# Patient Record
Sex: Male | Born: 1970 | Race: White | Hispanic: No | Smoking: Former smoker
Health system: Southern US, Community
[De-identification: ages and names within clinical notes are randomized; demographics above are authoritative.]

## PROBLEM LIST (undated history)

## (undated) DIAGNOSIS — I10 Essential (primary) hypertension: Secondary | ICD-10-CM

## (undated) DIAGNOSIS — F32A Depression, unspecified: Secondary | ICD-10-CM

## (undated) DIAGNOSIS — F329 Major depressive disorder, single episode, unspecified: Secondary | ICD-10-CM

## (undated) DIAGNOSIS — M519 Unspecified thoracic, thoracolumbar and lumbosacral intervertebral disc disorder: Secondary | ICD-10-CM

## (undated) DIAGNOSIS — I8390 Asymptomatic varicose veins of unspecified lower extremity: Secondary | ICD-10-CM

## (undated) HISTORY — DX: Depression, unspecified: F32.A

## (undated) HISTORY — DX: Essential (primary) hypertension: I10

## (undated) HISTORY — PX: OTHER SURGICAL HISTORY: SHX169

## (undated) HISTORY — PX: FINGER SURGERY: SHX640

## (undated) HISTORY — DX: Unspecified thoracic, thoracolumbar and lumbosacral intervertebral disc disorder: M51.9

## (undated) HISTORY — DX: Asymptomatic varicose veins of unspecified lower extremity: I83.90

## (undated) HISTORY — DX: Major depressive disorder, single episode, unspecified: F32.9

---

## 2000-06-07 ENCOUNTER — Emergency Department (HOSPITAL_COMMUNITY): Admission: EM | Admit: 2000-06-07 | Discharge: 2000-06-07 | Payer: Self-pay | Admitting: Emergency Medicine

## 2001-01-23 ENCOUNTER — Encounter: Payer: Self-pay | Admitting: Neurosurgery

## 2001-01-23 ENCOUNTER — Encounter: Admission: RE | Admit: 2001-01-23 | Discharge: 2001-01-23 | Payer: Self-pay | Admitting: Neurosurgery

## 2001-03-05 ENCOUNTER — Ambulatory Visit: Admission: RE | Admit: 2001-03-05 | Discharge: 2001-03-05 | Payer: Self-pay | Admitting: Family Medicine

## 2001-03-09 ENCOUNTER — Inpatient Hospital Stay (HOSPITAL_COMMUNITY): Admission: EM | Admit: 2001-03-09 | Discharge: 2001-03-11 | Payer: Self-pay | Admitting: Psychiatry

## 2001-05-30 ENCOUNTER — Emergency Department (HOSPITAL_COMMUNITY): Admission: EM | Admit: 2001-05-30 | Discharge: 2001-05-30 | Payer: Self-pay | Admitting: *Deleted

## 2001-06-16 ENCOUNTER — Encounter: Admission: RE | Admit: 2001-06-16 | Discharge: 2001-06-16 | Payer: Self-pay | Admitting: Neurosurgery

## 2001-06-16 ENCOUNTER — Encounter: Payer: Self-pay | Admitting: Neurosurgery

## 2001-06-30 ENCOUNTER — Encounter: Payer: Self-pay | Admitting: Neurosurgery

## 2001-06-30 ENCOUNTER — Encounter: Admission: RE | Admit: 2001-06-30 | Discharge: 2001-06-30 | Payer: Self-pay | Admitting: Neurosurgery

## 2001-08-20 ENCOUNTER — Encounter: Payer: Self-pay | Admitting: Neurosurgery

## 2001-08-20 ENCOUNTER — Encounter: Admission: RE | Admit: 2001-08-20 | Discharge: 2001-08-20 | Payer: Self-pay | Admitting: Neurosurgery

## 2001-10-26 ENCOUNTER — Encounter: Payer: Self-pay | Admitting: Emergency Medicine

## 2001-10-26 ENCOUNTER — Emergency Department (HOSPITAL_COMMUNITY): Admission: EM | Admit: 2001-10-26 | Discharge: 2001-10-26 | Payer: Self-pay | Admitting: Emergency Medicine

## 2001-12-03 ENCOUNTER — Ambulatory Visit (HOSPITAL_COMMUNITY): Admission: RE | Admit: 2001-12-03 | Discharge: 2001-12-03 | Payer: Self-pay | Admitting: Gastroenterology

## 2002-02-03 ENCOUNTER — Encounter: Admission: RE | Admit: 2002-02-03 | Discharge: 2002-02-03 | Payer: Self-pay | Admitting: Gastroenterology

## 2002-02-03 ENCOUNTER — Encounter: Payer: Self-pay | Admitting: Gastroenterology

## 2002-12-06 ENCOUNTER — Encounter: Payer: Self-pay | Admitting: Emergency Medicine

## 2002-12-06 ENCOUNTER — Emergency Department (HOSPITAL_COMMUNITY): Admission: EM | Admit: 2002-12-06 | Discharge: 2002-12-06 | Payer: Self-pay | Admitting: Emergency Medicine

## 2002-12-12 ENCOUNTER — Ambulatory Visit (HOSPITAL_COMMUNITY): Admission: RE | Admit: 2002-12-12 | Discharge: 2002-12-12 | Payer: Self-pay | Admitting: Emergency Medicine

## 2002-12-12 ENCOUNTER — Encounter: Payer: Self-pay | Admitting: Emergency Medicine

## 2006-03-28 ENCOUNTER — Emergency Department (HOSPITAL_COMMUNITY): Admission: EM | Admit: 2006-03-28 | Discharge: 2006-03-28 | Payer: Self-pay | Admitting: Emergency Medicine

## 2012-06-08 ENCOUNTER — Emergency Department (HOSPITAL_COMMUNITY)
Admission: EM | Admit: 2012-06-08 | Discharge: 2012-06-08 | Disposition: A | Discharge status: Home / Self Care | Payer: Self-pay | Attending: Emergency Medicine | Admitting: Emergency Medicine

## 2012-06-08 DIAGNOSIS — H538 Other visual disturbances: Secondary | ICD-10-CM | POA: Insufficient documentation

## 2012-06-08 DIAGNOSIS — R42 Dizziness and giddiness: Secondary | ICD-10-CM | POA: Insufficient documentation

## 2012-06-08 DIAGNOSIS — R51 Headache: Secondary | ICD-10-CM | POA: Insufficient documentation

## 2012-06-08 DIAGNOSIS — R110 Nausea: Secondary | ICD-10-CM | POA: Insufficient documentation

## 2012-06-08 DIAGNOSIS — R55 Syncope and collapse: Secondary | ICD-10-CM | POA: Insufficient documentation

## 2012-06-08 LAB — CBC WITH DIFFERENTIAL/PLATELET
Basophils Absolute: 0 10*3/uL (ref 0.0–0.1)
Basophils Relative: 0 % (ref 0–1)
Eosinophils Absolute: 0.2 10*3/uL (ref 0.0–0.7)
MCH: 33.7 pg (ref 26.0–34.0)
MCHC: 35.3 g/dL (ref 30.0–36.0)
Monocytes Relative: 10 % (ref 3–12)
Neutrophils Relative %: 56 % (ref 43–77)
Platelets: 279 10*3/uL (ref 150–400)
RDW: 13.2 % (ref 11.5–15.5)

## 2012-06-08 LAB — BASIC METABOLIC PANEL
Calcium: 9.1 mg/dL (ref 8.4–10.5)
GFR calc non Af Amer: >90 mL/min (ref >90)
Glucose, Bld: 87 mg/dL (ref 70–99)
Sodium: 137 mEq/L (ref 135–145)

## 2012-06-08 LAB — URINALYSIS, ROUTINE W REFLEX MICROSCOPIC
Hgb urine dipstick: NEGATIVE
Nitrite: NEGATIVE
Protein, ur: NEGATIVE mg/dL
Urobilinogen, UA: 0.2 mg/dL (ref 0.0–1.0)

## 2012-06-08 MED ORDER — SODIUM CHLORIDE 0.9 % IV BOLUS (SEPSIS)
1000.0000 mL | Freq: Once | INTRAVENOUS | Status: AC
  Administered 2012-06-08: 1000 mL via INTRAVENOUS

## 2012-06-08 MED ORDER — ACETAMINOPHEN 325 MG PO TABS
650.0000 mg | ORAL_TABLET | Freq: Once | ORAL | Status: AC
  Administered 2012-06-08: 650 mg via ORAL
  Filled 2012-06-08: qty 2

## 2012-06-08 MED ORDER — ONDANSETRON HCL 4 MG/2ML IJ SOLN
4.0000 mg | Freq: Once | INTRAMUSCULAR | Status: AC
  Administered 2012-06-08: 4 mg via INTRAVENOUS
  Filled 2012-06-08: qty 2

## 2012-06-08 NOTE — ED Provider Notes (Signed)
History   Scribed for [employer]. Bernette Mayers, MD, the patient was seen in room APA03/APA03 . This chart was scribed by Lewanda Rife.    CSN: [id number]  Arrival date & time 06/08/12  1626   First MD Initiated Contact with Patient 06/08/12 1629      Chief Complaint  Patient presents with  . Dizziness    (Consider location/radiation/quality/duration/timing/severity/associated sxs/prior treatment) HPI Christopher Bender is a 41 y.o. 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 Pt states while he was driving to work his vision became severely blurry and the roads looked distorted. Pt reports feeling "drunk" and lightheaded at work, also has a component of room spinning, but primarily a sensation of almost passing out. Pt called EMS and states he got a mild headache on the way to the hospital. Pt additionally reports nausea at this time. Pt's symptoms are relieved and aggravated by nothing.Pt denies problems urinating, diarrhea, bloody stools, bloody emesis, denies chest pain, shortness of breath. Pt reports having good appetite. Pt reports taking lisinopril, Celexa, and Soma at 8 am this morning. Pt reports taking Soma for a back injury he had a few weeks ago. Pt denies hx of hypoglycemia.   No past medical history on file.  No past surgical history on file.  No family history on file.  History  Substance Use Topics  . Smoking status: Not on file  . Smokeless tobacco: Not on file  . Alcohol Use: Not on file      Review of Systems A complete 10 system review of systems was obtained and all systems are negative except as noted in the HPI and PMH.   Allergies  Review of patient's allergies indicates not on file.  Home Medications  No current outpatient prescriptions on file.  BP 140/68  Pulse 108  Temp 98.3 F (36.8 C)  Resp 20  Ht 5\' 10"  (1.778 m)  Wt 305 lb (138.347 kg)  BMI 43.76 kg/m2   SpO2 98%  Physical Exam  Nursing note and vitals reviewed. Constitutional: He is oriented to person, place, and time. He appears well-developed and well-nourished.  HENT:  Head: Normocephalic and atraumatic.  Eyes: EOM are normal. Pupils are equal, round, and reactive to light.  Neck: Normal range of motion. Neck supple.  Cardiovascular: Normal rate, normal heart sounds and intact distal pulses.   Pulmonary/Chest: Effort normal and breath sounds normal.  Abdominal: Bowel sounds are normal. He exhibits no distension. There is no tenderness.  Musculoskeletal: Normal range of motion. He exhibits no edema and no tenderness.  Neurological: He is alert and oriented to person, place, and time. He has normal strength. No cranial nerve deficit or sensory deficit.       Normal finger to nose   Skin: Skin is warm and dry. No rash noted.  Psychiatric: He has a normal mood and affect.    ED Course  Procedures (including critical care time)  Labs Reviewed  URINALYSIS, ROUTINE W REFLEX MICROSCOPIC - Abnormal; Notable for the following:    Specific Gravity, Urine >1.030 (*)     All other components within normal limits  CBC WITH DIFFERENTIAL  BASIC METABOLIC PANEL  TROPONIN I   No results found.   No diagnosis found.    MDM   Date: 06/08/2012  Rate: 77  Rhythm: normal sinus rhythm  QRS Axis: normal  Intervals: normal  ST/T Wave abnormalities: normal  Conduction  Disutrbances: none  Narrative Interpretation: unremarkable   6:50 PM Pt feeling better now. Mildly orthostatic on standing, but no further dizziness. Labs are unremarkable. Will d/c hom.        I personally performed the services described in the documentation, which were scribed in my presence. The recorded information has been reviewed and considered.      Charles B. Bernette Mayers, MD 06/08/12 4132

## 2012-06-08 NOTE — ED Notes (Signed)
To ED from work via EMS, had sudden onset of light headedness, blurred vision at 1415, arrives with HA, and nausea, vision has improved, CBG 109, VSS, ambulatory to bed, NAD

## 2012-06-25 ENCOUNTER — Encounter: Payer: Self-pay | Admitting: Cardiology

## 2012-06-26 ENCOUNTER — Ambulatory Visit: Payer: Self-pay | Admitting: Cardiology

## 2012-07-14 ENCOUNTER — Other Ambulatory Visit (HOSPITAL_COMMUNITY): Payer: Self-pay | Admitting: Internal Medicine

## 2012-07-14 DIAGNOSIS — M545 Low back pain: Secondary | ICD-10-CM

## 2012-07-14 DIAGNOSIS — M5410 Radiculopathy, site unspecified: Secondary | ICD-10-CM

## 2012-07-17 ENCOUNTER — Ambulatory Visit (HOSPITAL_COMMUNITY)
Admission: RE | Admit: 2012-07-17 | Discharge: 2012-07-17 | Disposition: A | Discharge status: Home / Self Care | Payer: BC Managed Care – PPO | Source: Ambulatory Visit | Attending: Internal Medicine | Admitting: Internal Medicine

## 2012-07-17 DIAGNOSIS — M545 Low back pain: Secondary | ICD-10-CM

## 2012-07-17 DIAGNOSIS — M5137 Other intervertebral disc degeneration, lumbosacral region: Secondary | ICD-10-CM | POA: Insufficient documentation

## 2012-07-17 DIAGNOSIS — M47814 Spondylosis without myelopathy or radiculopathy, thoracic region: Secondary | ICD-10-CM | POA: Insufficient documentation

## 2012-07-17 DIAGNOSIS — M5410 Radiculopathy, site unspecified: Secondary | ICD-10-CM

## 2012-07-17 DIAGNOSIS — M51379 Other intervertebral disc degeneration, lumbosacral region without mention of lumbar back pain or lower extremity pain: Secondary | ICD-10-CM | POA: Insufficient documentation

## 2012-08-16 ENCOUNTER — Encounter: Payer: Self-pay | Admitting: Cardiology

## 2012-08-16 DIAGNOSIS — I10 Essential (primary) hypertension: Secondary | ICD-10-CM | POA: Insufficient documentation

## 2012-08-17 ENCOUNTER — Ambulatory Visit (INDEPENDENT_AMBULATORY_CARE_PROVIDER_SITE_OTHER): Payer: BC Managed Care – PPO | Admitting: Cardiology

## 2012-08-17 ENCOUNTER — Encounter: Payer: Self-pay | Admitting: Cardiology

## 2012-08-17 ENCOUNTER — Encounter: Payer: Self-pay | Admitting: *Deleted

## 2012-08-17 VITALS — BP 131/87 | HR 77 | Ht 70.0 in | Wt 319.0 lb

## 2012-08-17 DIAGNOSIS — R9431 Abnormal electrocardiogram [ECG] [EKG]: Secondary | ICD-10-CM | POA: Insufficient documentation

## 2012-08-17 DIAGNOSIS — I10 Essential (primary) hypertension: Secondary | ICD-10-CM

## 2012-08-17 DIAGNOSIS — R079 Chest pain, unspecified: Secondary | ICD-10-CM

## 2012-08-17 DIAGNOSIS — R0989 Other specified symptoms and signs involving the circulatory and respiratory systems: Secondary | ICD-10-CM

## 2012-08-17 DIAGNOSIS — R569 Unspecified convulsions: Secondary | ICD-10-CM

## 2012-08-17 DIAGNOSIS — R072 Precordial pain: Secondary | ICD-10-CM

## 2012-08-17 NOTE — Patient Instructions (Addendum)
Your physician has requested that you have en exercise stress myoview. For further information please visit [email or web address] Please follow instruction sheet, as given. We will call you with your results.

## 2012-08-17 NOTE — Assessment & Plan Note (Signed)
Blood pressure control looks reasonably good today. ACE inhibitor dose was just increased by Dr. Margo Aye.

## 2012-08-17 NOTE — Assessment & Plan Note (Signed)
Have encouraged him to work on diet, consider getting back to a regular walking regimen, presuming his stress test is reassuring.

## 2012-08-17 NOTE — Assessment & Plan Note (Signed)
Atypical and infrequent. Does have family history of premature CAD in his father, personal risk factors including gender, hypertension, and morbid obesity. Plan will be to proceed with an exercise Cardiolite, 2 day protocol, converting to [place] if needed. We will inform him of the results.

## 2012-08-17 NOTE — Assessment & Plan Note (Signed)
Etiology not clear. Does not appear to be any definite precipitant. Blood pressure has been elevated these times. Seems to be also related to emotional stress and upset, patient mentions feeling of "panic." Incidentally, he feels somewhat better since being on Valium, being used to treat his lumbar pain. ECG is abnormal but nonspecific with increased voltage. Further ischemic testing is being arranged, as noted.

## 2012-08-17 NOTE — Progress Notes (Signed)
Clinical Summary Christopher Bender is a 41 y.o.male referred for cardiology consultation by Dr. Margo Aye. Records indicate that he was actually referred for evaluation back in October, did not present at that time. He was seen at the ER at [facility] back in October due to an unusual sensation, describes numbness in his arms, and a feeling of being "drunk" without any obvious precipitant. Noted to be hypertensive at that time. ECG from 10/7 showed sinus rhythm with increased voltage. Troponin I was normal at that time. He also was seen at the ER at [facility] on 11/25 with complaints of headache, elevated blood pressure, states that he had been under a very stressful situation at work, became upset, and felt that he was "panicking." Records indicate that the symptoms had subsided on ER evaluation, blood pressure was 134/78, heart rate 82.  Patient states he has been on antihypertensives for the last few years. Typically seems to be reasonably controlled he checks at home. He seems to point toward stress being a precipitant for a lot of his symptoms, also feeling of "panic." No specific palpitations with these symptoms. He has no regular chest pain with exertion although has had some intermittent episodes of discomfort in his substernal region without clear precipitant to. He does have family history of premature CAD in his father. Reports NYHA class II dyspnea on exertion typically. He has not undergone any prior cardiac evaluations, was referred for stress testing by Dr. Margo Aye in review of records.  Interestingly, he states that prior to having inguinal hernia surgery a year ago, he was walking up to 10 miles at a time at a local trail, his weight was significantly lower, and he felt much better.   Allergies  Allergen Reactions  . Flexeril (Cyclobenzaprine) Other (See Comments)    REACTIONS: Headaches and flushing ("feels hot")    Current Outpatient Prescriptions  Medication Sig Dispense Refill  . citalopram  (CELEXA) 20 MG tablet Take 20 mg by mouth daily.      . diazepam (VALIUM) 5 MG tablet Take 1 tablet by mouth Twice daily as needed.      Marland Kitchen lisinopril (PRINIVIL,ZESTRIL) 20 MG tablet Take 20 mg by mouth daily.      . Menthol, Topical Analgesic, 7.5 % (ROLL) MISC Apply 1 each topically daily as needed. For back pain      . oxyCODONE-acetaminophen (PERCOCET/ROXICET) 5-325 MG per tablet Take 1 tablet by mouth Every 4 hours as needed.      . Soft Lens Products (REWETTING DROPS) SOLN 1 drop by Does not apply route daily as needed.      Marland Kitchen UNABLE TO FIND Take 1 tablet by mouth 4 (four) times daily. Med Name: Christopher Bender        Past Medical History  Diagnosis Date  . Essential hypertension, benign   . Depression   . Lumbar disc disease     Past Surgical History  Procedure Date  . Right inguinal hernia repair   . Finger surgery     Family History  Problem Relation Age of Onset  . CAD Father   . Heart attack Father     Age 2    Social History Christopher Bender reports that he quit smoking about 3 years ago. His smoking use included Cigarettes. He started smoking about 23 years ago. He has a 20 pack-year smoking history. He has never used smokeless tobacco. Christopher Bender reports that he does not drink alcohol.  Review of Systems No frank syncope. No  orthopnea or PND. No pitting edema. Reports compliance with his medications. He is not exercising at this point. States that diet has been suboptimal at best. Limited by lumbar back pain. Otherwise negative.  Physical Examination Filed Vitals:   08/17/12 1304  BP: 131/87  Pulse: 77   Filed Weights   08/17/12 1304  Weight: 319 lb (144.697 kg)   Morbidly obese male in no acute distress. HEENT: Conjunctiva and lids normal, oropharynx clear with moist mucosa. Neck: Supple, no elevated JVP or carotid bruits, no thyromegaly. Lungs: Clear to auscultation, nonlabored breathing at rest. Cardiac: Regular rate and rhythm, no S3 or significant systolic  murmur, no pericardial rub. Abdomen: Soft, nontender, protuberant, bowel sounds present, no guarding or rebound. Extremities: No pitting edema, distal pulses 2+. Skin: Warm and dry. Musculoskeletal: No kyphosis. Neuropsychiatric: Alert and oriented x3, affect grossly appropriate.   Problem List and Plan   Paroxysmal spells Etiology not clear. Does not appear to be any definite precipitant. Blood pressure has been elevated these times. Seems to be also related to emotional stress and upset, patient mentions feeling of "panic." Incidentally, he feels somewhat better since being on Valium, being used to treat his lumbar pain. ECG is abnormal but nonspecific with increased voltage. Further ischemic testing is being arranged, as noted.  Precordial pain Atypical and infrequent. Does have family history of premature CAD in his father, personal risk factors including gender, hypertension, and morbid obesity. Plan will be to proceed with an exercise Cardiolite, 2 day protocol, converting to [place] if needed. We will inform him of the results.  Morbid obesity Have encouraged him to work on diet, consider getting back to a regular walking regimen, presuming his stress test is reassuring.  Essential hypertension, benign Blood pressure control looks reasonably good today. ACE inhibitor dose was just increased by Dr. Margo Aye.    Jonelle Sidle, M.D., F.A.C.C.

## 2012-08-18 ENCOUNTER — Other Ambulatory Visit: Payer: Self-pay | Admitting: *Deleted

## 2012-08-18 DIAGNOSIS — R079 Chest pain, unspecified: Secondary | ICD-10-CM

## 2012-08-18 DIAGNOSIS — R9431 Abnormal electrocardiogram [ECG] [EKG]: Secondary | ICD-10-CM

## 2012-08-19 ENCOUNTER — Telehealth: Payer: Self-pay

## 2012-08-19 NOTE — Telephone Encounter (Signed)
2 Day Exercise Cardiolite on medications            Order Questions       Question  Answer  Comment    Where should this test be performed  Other      Type of stress  Exercise      Patient weight in lbs  319     2 DAY EXERCISE CARDIOLITE set for 12-23 & 12-24 [facility] Checking percert                   Additional Information

## 2012-08-19 NOTE — Telephone Encounter (Signed)
What is date of test?

## 2012-08-20 NOTE — Telephone Encounter (Signed)
Pt has [employer] [place].  Per Cy Blamer, [phone], no precert required

## 2012-08-20 NOTE — Telephone Encounter (Signed)
Monday and Tuesday 12/23 & 12/24

## 2012-09-09 ENCOUNTER — Telehealth: Payer: Self-pay | Admitting: Cardiology

## 2012-09-09 NOTE — Telephone Encounter (Signed)
Christopher Bender called to cancel his exercise stress myoview. States that he has to much going on at this time. Will call later to re-schedule.

## 2013-06-15 ENCOUNTER — Ambulatory Visit (HOSPITAL_COMMUNITY)
Admission: RE | Admit: 2013-06-15 | Discharge: 2013-06-15 | Disposition: A | Discharge status: Home / Self Care | Payer: BC Managed Care – PPO | Source: Ambulatory Visit | Attending: Podiatry | Admitting: Podiatry

## 2013-06-15 DIAGNOSIS — M79609 Pain in unspecified limb: Secondary | ICD-10-CM | POA: Insufficient documentation

## 2013-06-15 DIAGNOSIS — M7918 Myalgia, other site: Secondary | ICD-10-CM

## 2013-06-15 DIAGNOSIS — R262 Difficulty in walking, not elsewhere classified: Secondary | ICD-10-CM | POA: Insufficient documentation

## 2013-06-15 DIAGNOSIS — M6240 Contracture of muscle, unspecified site: Secondary | ICD-10-CM

## 2013-06-15 DIAGNOSIS — M25571 Pain in right ankle and joints of right foot: Secondary | ICD-10-CM

## 2013-06-15 DIAGNOSIS — IMO0001 Reserved for inherently not codable concepts without codable children: Secondary | ICD-10-CM | POA: Insufficient documentation

## 2013-06-16 DIAGNOSIS — M7918 Myalgia, other site: Secondary | ICD-10-CM | POA: Insufficient documentation

## 2013-06-16 DIAGNOSIS — M25579 Pain in unspecified ankle and joints of unspecified foot: Secondary | ICD-10-CM | POA: Insufficient documentation

## 2013-06-16 DIAGNOSIS — M6240 Contracture of muscle, unspecified site: Secondary | ICD-10-CM | POA: Insufficient documentation

## 2013-06-16 NOTE — Evaluation (Addendum)
Physical Therapy Evaluation  Patient Details  Name: Christopher Bender MRN: [id number] Date of Birth: June 07, 1971  Today's Date: 06/15/2013 Time: 4098-1191 PT Time Calculation (min): 46 min Charges: 1 evaluation              Visit#: 2 of 8  Re-eval: 07/15/13 Assessment Diagnosis: Rt plantar fascitis Surgical Date: 03/17/13 Next MD Visit: Dr. Hal Neer - Oct 31 Prior Therapy: None  Past Medical History:  Past Medical History  Diagnosis Date  . Essential hypertension, benign   . Depression   . Lumbar disc disease    Past Surgical History:  Past Surgical History  Procedure Laterality Date  . Right inguinal hernia repair    . Finger surgery     Subjective Symptoms/Limitations Symptoms: Pt is a 42 year old male referred to PT s/p Rt plantar fascial release on 03/17/2013. He has had conservative treatment in the past with injections and CAM boot, has used multiple different OTC orthotics without any success.  He is sleeping with his night splint.  He states that he has gained 30lbs since the surgery.  Pertinent History: PMH: 3 herniated disc How long can you stand comfortably?: more than an hour Pain Assessment Currently in Pain?: Yes Pain Score: 4  (with palpattion) Pain Location: Foot Pain Orientation: Right Pain Type: Acute pain;Surgical pain Pain Onset: More than a month ago Pain Frequency: Intermittent  Precautions/Restrictions  Precautions Precautions: None  Balance Screening Balance Screen Has the patient fallen in the past 6 months: No Has the patient had a decrease in activity level because of a fear of falling? : No Is the patient reluctant to leave their home because of a fear of falling? : No  Prior Functioning  Prior Function Level of Independence: Independent with basic ADLs  Able to Take Stairs?: Reciprically Driving: Yes Vocation: Full time employment Vocation Requirements: [employer]- 12 hours on hard surfaces  Cognition/Observation Observation/Other  Assessments Observations: Lt leg length discrepancy  Sensation/Coordination/Flexibility/Functional Tests Functional Tests Functional Tests: Lower Extremity Functional Scale (LEFS): 43/80 Impaired Great toe and digit coordinate movements  Assessment Palpation Palpation: maximal fascial restrictions to medial calcaneous  Mobility/Balance  Ambulation/Gait Ambulation/Gait: Yes Gait Pattern: Decreased stance time - right   Exercise/Treatments Ankle Stretches Other Stretch: Hamstring Strech 1x30; ITB stretch 1x30; long sitting HS stretch 1x3- sec Ankle Exercises - Seated Towel Crunch: 1 rep Other Seated Ankle Exercises: Toe Yoga: Great toe opp of 4 digits x10; toe abduction x10  Physical Therapy Assessment and Plan PT Assessment and Plan Clinical Impression Statement: Mr. Christopher Bender is a 42 year old male is referred to PT s/p Rt plantar fascia release with impairments listed below. After evaluation it is found that he has significant fascial restriction and complaints of radicular pain to his RLE which is worse with sitting.  Pt will benefit from skilled therapeutic intervention in order to improve on the following deficits: Pain;Increased fascial restricitons;Increased muscle spasms;Improper spinal/pelvic alignment Rehab Potential: Good PT Frequency: Min 2X/week PT Duration: 4 weeks PT Treatment/Interventions: DME instruction;Gait training;Stair training;Functional mobility training;Therapeutic activities;Therapeutic exercise;Balance training;Neuromuscular re-education;Patient/family education;Manual techniques PT Plan: manual techniques for scar tissue to Rt foot/gastroc, hamstring, ITB and quadricep. Continue with foot strengthening activities, SLS on foam.     Goals Home Exercise Program Pt/caregiver will Perform Home Exercise Program: Independently PT Goal: Perform Home Exercise Program - Progress: Goal set today PT Short Term Goals Time to Complete Short Term Goals: 4 weeks PT  Short Term Goal 1: Pt will decrease fascial restriction to minimal  to his Rt foot, gastroc, hamstring and adductors to improve gait mechanics  PT Short Term Goal 2: Pt will improve his LEFS score to greater than 63/80 for improved percieved functional abilty  Problem List Patient Active Problem List   Diagnosis Date Noted  . Contracture of tendon (sheath) 06/16/2013  . Myofascial pain 06/16/2013  . Pain in joint, ankle and foot 06/16/2013  . Precordial pain 08/17/2012  . Abnormal ECG 08/17/2012  . Morbid obesity 08/17/2012  . Paroxysmal spells 08/17/2012  . Essential hypertension, benign 08/16/2012    PT - End of Session Activity Tolerance: Patient tolerated treatment well PT Plan of Care PT Home Exercise Plan: given PT Patient Instructions: importance of HEP,  "The Stick" self massager Consulted and Agree with Plan of Care: Patient  GP    Sanaz Scarlett, MPT, ATC 06/16/2013, 5:58 PM  Physician Documentation Your signature is required to indicate approval of the treatment plan as stated above.  Please sign and either send electronically or make a copy of this report for your files and return this physician signed original.   Please mark one 1.__approve of plan  2. ___approve of plan with the following conditions.   ______________________________                                                          _____________________ Physician Signature                                                                                                             Date

## 2013-06-17 ENCOUNTER — Telehealth (HOSPITAL_COMMUNITY): Payer: Self-pay

## 2013-06-17 ENCOUNTER — Ambulatory Visit (HOSPITAL_COMMUNITY): Payer: BC Managed Care – PPO

## 2013-06-22 ENCOUNTER — Ambulatory Visit (HOSPITAL_COMMUNITY): Payer: BC Managed Care – PPO | Admitting: Physical Therapy

## 2013-06-29 ENCOUNTER — Ambulatory Visit (HOSPITAL_COMMUNITY): Payer: BC Managed Care – PPO | Admitting: Physical Therapy

## 2013-07-01 ENCOUNTER — Inpatient Hospital Stay (HOSPITAL_COMMUNITY)
Admission: RE | Admit: 2013-07-01 | Payer: BC Managed Care – PPO | Source: Ambulatory Visit | Admitting: Physical Therapy

## 2013-07-13 ENCOUNTER — Telehealth (HOSPITAL_COMMUNITY): Payer: Self-pay

## 2013-07-13 ENCOUNTER — Ambulatory Visit (HOSPITAL_COMMUNITY): Payer: BC Managed Care – PPO | Admitting: Physical Therapy

## 2014-04-18 ENCOUNTER — Other Ambulatory Visit: Payer: Self-pay | Admitting: *Deleted

## 2014-04-18 DIAGNOSIS — I83893 Varicose veins of bilateral lower extremities with other complications: Secondary | ICD-10-CM

## 2014-04-29 ENCOUNTER — Encounter: Payer: Self-pay | Admitting: Vascular Surgery

## 2014-05-02 ENCOUNTER — Encounter: Payer: Self-pay | Admitting: Vascular Surgery

## 2014-05-02 ENCOUNTER — Ambulatory Visit (INDEPENDENT_AMBULATORY_CARE_PROVIDER_SITE_OTHER): Payer: BC Managed Care – PPO | Admitting: Vascular Surgery

## 2014-05-02 ENCOUNTER — Ambulatory Visit (HOSPITAL_COMMUNITY)
Admission: RE | Admit: 2014-05-02 | Discharge: 2014-05-02 | Disposition: A | Discharge status: Home / Self Care | Payer: BC Managed Care – PPO | Source: Ambulatory Visit | Attending: Vascular Surgery | Admitting: Vascular Surgery

## 2014-05-02 VITALS — BP 138/86 | HR 74 | Resp 16 | Ht 70.0 in | Wt 325.0 lb

## 2014-05-02 DIAGNOSIS — I872 Venous insufficiency (chronic) (peripheral): Secondary | ICD-10-CM | POA: Diagnosis not present

## 2014-05-02 DIAGNOSIS — I83892 Varicose veins of left lower extremities with other complications: Secondary | ICD-10-CM | POA: Insufficient documentation

## 2014-05-02 DIAGNOSIS — I83893 Varicose veins of bilateral lower extremities with other complications: Secondary | ICD-10-CM | POA: Insufficient documentation

## 2014-05-02 NOTE — Progress Notes (Signed)
Subjective:     Patient ID: Christopher Bender, male   DOB: Mar 08, 1971, 43 y.o.   MRN: [id number]  HPI this 43 year old male is referred for evaluation of painful varicosities in the left leg. Patient has noted bulging veins in the medial aspect of his left thigh for the past 3-4 years. He has had increasing numbness aching and throbbing discomfort in that area as well as some mild edema in the left ankle. He has no history of DVT or thrombophlebitis or stasis ulcers. It is our last compression stockings. He works on his feet all blood and the symptoms worsen as the day progresses. He has no symptoms in the contralateral right leg.  Past Medical History  Diagnosis Date  . Essential hypertension, benign   . Depression   . Lumbar disc disease   . Varicose veins     History  Substance Use Topics  . Smoking status: Former Smoker -- 1.00 packs/day for 20 years    Types: Cigarettes    Start date: 09/02/1988    Quit date: 09/08/2008  . Smokeless tobacco: Never Used  . Alcohol Use: No    Family History  Problem Relation Age of Onset  . CAD Father   . Heart attack Father     Age 27  . Hypertension Mother     Allergies  Allergen Reactions  . Flexeril [Cyclobenzaprine] Other (See Comments)    REACTIONS: Headaches and flushing ("feels hot")    Current outpatient prescriptions:citalopram (CELEXA) 20 MG tablet, Take 20 mg by mouth daily., Disp: , Rfl: ;  diazepam (VALIUM) 5 MG tablet, Take 1 tablet by mouth Twice daily as needed., Disp: , Rfl: ;  losartan (COZAAR) 50 MG tablet, Take 50 mg by mouth daily., Disp: , Rfl: ;  Menthol, Topical Analgesic, 7.5 % (ROLL) MISC, Apply 1 each topically daily as needed. For back pain, Disp: , Rfl:  oxyCODONE-acetaminophen (PERCOCET/ROXICET) 5-325 MG per tablet, Take 1 tablet by mouth Every 4 hours as needed., Disp: , Rfl: ;  pregabalin (LYRICA) 150 MG capsule, Take 150 mg by mouth 2 (two) times daily., Disp: , Rfl: ;  Soft Lens Products (REWETTING DROPS) SOLN, 1  drop by Does not apply route daily as needed., Disp: , Rfl: ;  lisinopril (PRINIVIL,ZESTRIL) 20 MG tablet, Take 20 mg by mouth daily., Disp: , Rfl:  UNABLE TO FIND, Take 1 tablet by mouth 4 (four) times daily. Med Name: FIBRO-RESPONSE, Disp: , Rfl:   BP 138/86  Pulse 74  Resp 16  Ht  (1.778 m)  Wt 325 lb (147.419 kg)  BMI 46.63 kg/m2  Body mass index is 46.63 kg/(m^2).           Review of Systems denies chest pain but does have occasional orthopnea, leg discomfort with walking, weakness and numbness in the legs, dizziness, history of depression. All other systems negative the complete review of systems     Objective:   Physical Exam BP 138/86  Pulse 74  Resp 16  Ht  (1.778 m)  Wt 325 lb (147.419 kg)  BMI 46.63 kg/m2  Gen.-alert and oriented x3 in no apparent distress-obese HEENT normal for age Lungs no rhonchi or wheezing Cardiovascular regular rhythm no murmurs carotid pulses 3+ palpable no bruits audible Abdomen soft nontender no palpable masses-obese  Musculoskeletal free of  major deformities Skin clear -no rashes Neurologic normal Lower extremities 3+ femoral and dorsalis pedis pulses palpable bilaterally with 1+ edema on the left no edema on right.  Bulging varicosities in the great saphenous system the left leg and medial thigh extending down to the knee with a febrile of the knee and medial calf. No active ulceration or hyperpigmentation noted.  I ordered a venous duplex exam of the left leg which I reviewed and interpreted. There is gross reflux throughout the left great saphenous system supplying these bulging varicosities with no DVT. There is some deep reflux in the common superficial femoral and popliteal veins as well.       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

## 2014-06-15 ENCOUNTER — Encounter: Payer: Self-pay | Admitting: Internal Medicine

## 2014-07-19 ENCOUNTER — Ambulatory Visit: Payer: BC Managed Care – PPO | Admitting: Gastroenterology

## 2014-08-01 ENCOUNTER — Encounter: Payer: Self-pay | Admitting: Vascular Surgery

## 2014-08-02 ENCOUNTER — Ambulatory Visit: Payer: BC Managed Care – PPO | Admitting: Vascular Surgery

## 2014-08-23 ENCOUNTER — Telehealth: Payer: Self-pay | Admitting: Gastroenterology

## 2014-08-23 ENCOUNTER — Ambulatory Visit: Payer: BC Managed Care – PPO | Admitting: Gastroenterology

## 2014-08-23 ENCOUNTER — Encounter: Payer: Self-pay | Admitting: Gastroenterology

## 2014-08-23 NOTE — Telephone Encounter (Signed)
PATIENT WAS A NO SHOW 08/23/14 AND LETTER SENT

## 2014-12-09 ENCOUNTER — Encounter: Payer: Self-pay | Admitting: Vascular Surgery

## 2014-12-12 ENCOUNTER — Ambulatory Visit (INDEPENDENT_AMBULATORY_CARE_PROVIDER_SITE_OTHER): Payer: BLUE CROSS/BLUE SHIELD | Admitting: Vascular Surgery

## 2014-12-12 ENCOUNTER — Encounter: Payer: Self-pay | Admitting: Vascular Surgery

## 2014-12-12 VITALS — BP 111/77 | HR 100 | Resp 14 | Ht 70.0 in | Wt 303.6 lb

## 2014-12-12 DIAGNOSIS — I83892 Varicose veins of left lower extremities with other complications: Secondary | ICD-10-CM

## 2014-12-12 DIAGNOSIS — I83899 Varicose veins of unspecified lower extremities with other complications: Secondary | ICD-10-CM | POA: Insufficient documentation

## 2014-12-12 NOTE — Progress Notes (Signed)
Subjective:     Patient ID: Christopher Bender, male   DOB: [birth year], 45 y.o.   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 He has been found to have gross reflux in the left great saphenous vein supplying these painful varicosities. He continues to have aching throbbing and burning discomfort in the left distal thigh and calf with some edema in the ankle as the day progresses. He has tried long-leg elastic compression stockings 20-30 millimeter gradient as well as elevation and ibuprofen with no success. His symptoms are affecting his daily living and ability to work.  Past Medical History  Diagnosis Date  . Essential hypertension, benign   . Depression   . Lumbar disc disease   . Varicose veins     History  Substance Use Topics  . Smoking status: Former Smoker -- 1.00 packs/day for 20 years    Types: Cigarettes    Start date: 09/02/1988    Quit date: 09/08/2008  . Smokeless tobacco: Never Used  . Alcohol Use: No    Family History  Problem Relation Age of Onset  . CAD Father   . Heart attack Father     Age 64  . Hypertension Mother     Allergies  Allergen Reactions  . Flexeril [Cyclobenzaprine] Other (See Comments)    REACTIONS: Headaches and flushing ("feels hot")     Current outpatient prescriptions:  .  citalopram (CELEXA) 20 MG tablet, Take 20 mg by mouth daily., Disp: , Rfl:  .  diazepam (VALIUM) 5 MG tablet, Take 1 tablet by mouth Twice daily as needed., Disp: , Rfl:  .  lisinopril (PRINIVIL,ZESTRIL) 20 MG tablet, Take 20 mg by mouth daily., Disp: , Rfl:  .  losartan (COZAAR) 50 MG tablet, Take 50 mg by mouth daily., Disp: , Rfl:  .  oxyCODONE-acetaminophen (PERCOCET/ROXICET) 5-325 MG per tablet, Take 1 tablet by mouth Every 4 hours as needed., Disp: , Rfl:  .  Soft Lens Products (REWETTING DROPS) SOLN, 1 drop by Does not apply route daily as needed., Disp: , Rfl:  .  Menthol, Topical  Analgesic, 7.5 % (ROLL) MISC, Apply 1 each topically daily as needed. For back pain, Disp: , Rfl:  .  pregabalin (LYRICA) 150 MG capsule, Take 150 mg by mouth 2 (two) times daily., Disp: , Rfl:  .  UNABLE TO FIND, Take 1 tablet by mouth 4 (four) times daily. Med Name: Marica Otter, Disp: , Rfl:   Filed Vitals:   12/12/14 1034  BP: 111/77  Pulse: 100  Resp: 14  Height:  (1.778 m)  Weight: 303 lb 9.6 oz (137.712 kg)    Body mass index is 43.56 kg/(m^2).           Review of Systems does have chronic back discomfort and will need back surgery in the future. Denies chest pain, dyspnea on exertion, PND, orthopnea, hemoptysis.     Objective:   Physical Exam BP 111/77 mmHg  Pulse 100  Resp 14  Ht  (1.778 m)  Wt 303 lb 9.6 oz (137.712 kg)  BMI 43.56 kg/m2  Gen. well-develope  male in no apparent distress alert and oriented 3 Lungs no rhonchi or wheezing Left leg with nest of bulging varicosities in the medial distal thigh extending into the posterior thigh and popliteal area. 1+ distal edema. 3+ dorsalis pedis pulse palpable distally.      Assessment:     painful varicosities  left leg which are resistant to conservative measures including long-leg elastic compression stockings 20-30 millimeter gradient, elevation, and ibuprofen. These are due to gross reflux left great saphenous system. They are causing symptoms which are affecting his daily living and ability to work.    Plan:     patient needs laser ablation left great saphenous vein +10-20 stab phlebectomy of painful varicosities to relieve his symptoms We will proceed with precertification to perform this in the near future

## 2014-12-13 ENCOUNTER — Other Ambulatory Visit (HOSPITAL_COMMUNITY): Payer: Self-pay | Admitting: Radiology

## 2014-12-13 DIAGNOSIS — G4710 Hypersomnia, unspecified: Secondary | ICD-10-CM

## 2014-12-13 DIAGNOSIS — G4730 Sleep apnea, unspecified: Principal | ICD-10-CM

## 2014-12-28 ENCOUNTER — Ambulatory Visit: Payer: BLUE CROSS/BLUE SHIELD | Attending: Internal Medicine | Admitting: Sleep Medicine

## 2014-12-28 DIAGNOSIS — G4730 Sleep apnea, unspecified: Secondary | ICD-10-CM | POA: Insufficient documentation

## 2014-12-28 DIAGNOSIS — G4710 Hypersomnia, unspecified: Secondary | ICD-10-CM | POA: Insufficient documentation

## 2014-12-30 NOTE — Sleep Study (Signed)
  [facility] Christopher Meland A. Gerilyn Pilgrimoonquah, MD     [email or web address]        NOCTURNAL POLYSOMNOGRAM    LOCATION: SLEEP LAB FACILITY: [facility]   PHYSICIAN: Hagen Bohorquez A. Gerilyn Bender, M.D.   DATE OF STUDY: 12/28/2014.   REFERRING PHYSICIAN: Dwana MelenaZack Hall.   INDICATIONS: The patient is a 44 year old who presents with loud snoring, difficulty falling asleep and fatigue.  MEDICATIONS:  Prior to Admission medications   Medication Sig Start Date End Date Taking? Authorizing Provider  citalopram (CELEXA) 20 MG tablet Take 20 mg by mouth daily.    Historical Provider, MD  diazepam (VALIUM) 5 MG tablet Take 1 tablet by mouth Twice daily as needed. 08/04/12   Historical Provider, MD  lisinopril (PRINIVIL,ZESTRIL) 20 MG tablet Take 20 mg by mouth daily.    Historical Provider, MD  losartan (COZAAR) 50 MG tablet Take 50 mg by mouth daily.    Historical Provider, MD  Menthol, Topical Analgesic, 7.5 % (ROLL) MISC Apply 1 each topically daily as needed. For back pain    Historical Provider, MD  oxyCODONE-acetaminophen (PERCOCET/ROXICET) 5-325 MG per tablet Take 1 tablet by mouth Every 4 hours as needed. 08/04/12   Historical Provider, MD  pregabalin (LYRICA) 150 MG capsule Take 150 mg by mouth 2 (two) times daily.    Historical Provider, MD  Soft Lens Products (REWETTING DROPS) SOLN 1 drop by Does not apply route daily as needed.    Historical Provider, MD  UNABLE TO FIND Take 1 tablet by mouth 4 (four) times daily. Med Name: FIBRO-RESPONSE    Historical Provider, MD      EPWORTH SLEEPINESS SCALE: 3.   BMI: 43.   ARCHITECTURAL SUMMARY: The study is a split-night recording with the initial portion been a diagnostic in the second portion the titration recording. Total recording time was 410 minutes. Sleep efficiency 71 %. Sleep latency 45 minutes. REM latency 105 minutes. Stage NI 7 %, N2 82 % and N3 0.2 % and REM sleep 11 %.    RESPIRATORY DATA:  Baseline oxygen saturation is 96 %. The lowest saturation  is 84 %. The diagnostic AHI is 61. The patient was placed on positive pressure starting at 5 and increase 11. Optimal pressure is 11 with resolution of obstructive events and snoring.  LIMB MOVEMENT SUMMARY: PLM index 4.   ELECTROCARDIOGRAM SUMMARY: Average heart rate is 58 with no significant dysrhythmias observed.   IMPRESSION:  1. Severe obstructive sleep apnea syndrome which responds well to his CPAP of 11.  Thanks for this referral.  Sharyah Bostwick A. Gerilyn Bender, M.D. Diplomat, Biomedical engineerAmerican Board of Sleep Medicine.

## 2015-01-09 ENCOUNTER — Other Ambulatory Visit: Payer: Self-pay | Admitting: *Deleted

## 2015-01-09 DIAGNOSIS — I83892 Varicose veins of left lower extremities with other complications: Secondary | ICD-10-CM

## 2015-01-20 ENCOUNTER — Other Ambulatory Visit: Payer: Self-pay | Admitting: Anesthesiology

## 2015-01-20 DIAGNOSIS — M5126 Other intervertebral disc displacement, lumbar region: Secondary | ICD-10-CM

## 2015-01-20 DIAGNOSIS — M545 Low back pain: Secondary | ICD-10-CM

## 2015-01-20 DIAGNOSIS — M5136 Other intervertebral disc degeneration, lumbar region: Secondary | ICD-10-CM

## 2015-02-09 ENCOUNTER — Ambulatory Visit
Admission: RE | Admit: 2015-02-09 | Discharge: 2015-02-09 | Disposition: A | Discharge status: Home / Self Care | Payer: BLUE CROSS/BLUE SHIELD | Source: Ambulatory Visit | Attending: Anesthesiology | Admitting: Anesthesiology

## 2015-02-09 DIAGNOSIS — M545 Low back pain: Secondary | ICD-10-CM

## 2015-02-22 ENCOUNTER — Encounter: Payer: Self-pay | Admitting: Vascular Surgery

## 2015-02-27 ENCOUNTER — Ambulatory Visit (INDEPENDENT_AMBULATORY_CARE_PROVIDER_SITE_OTHER): Payer: BLUE CROSS/BLUE SHIELD | Admitting: Vascular Surgery

## 2015-02-27 ENCOUNTER — Encounter: Payer: Self-pay | Admitting: Vascular Surgery

## 2015-02-27 VITALS — BP 125/87 | HR 64 | Resp 18 | Ht 70.0 in | Wt 315.0 lb

## 2015-02-27 DIAGNOSIS — I83892 Varicose veins of left lower extremities with other complications: Secondary | ICD-10-CM | POA: Diagnosis not present

## 2015-02-27 NOTE — Progress Notes (Signed)
Subjective:     Patient ID: Christopher Bender, male   DOB: September 20, 1970, 44 y.o.   MRN: [id number]  HPI this 44 year old male had laser ablation of the left great saphenous vein from the distal thigh to near the saphenofemoral junction performed under local tumescent anesthesia +10-20 stab phlebectomy of painful varicosities. He tolerated the procedures well.   Review of Systems     Objective:   Physical Exam BP 125/87 mmHg  Pulse 64  Resp 18  Ht 5\' 10"  (1.778 m)  Wt 315 lb (142.883 kg)  BMI 45.20 kg/m2       Assessment:     Well-tolerated laser ablation left right saphenous vein plus multiple stab phlebectomy of painful varicosities performed under local tumescent anesthesia    Plan:     Return in 1 week for venous duplex exam to confirm closure left great saphenous vein This will complete the patient's treatment regimen

## 2015-02-27 NOTE — Progress Notes (Signed)
Laser Ablation Procedure    Date: 02/27/2015   Christopher Bender DOB:06/22/1971  Consent signed: Yes    Surgeon:  Dr. Quita SkyeJames D. Hart RochesterLawson  Procedure: Laser Ablation: left Greater Saphenous Vein  Dr. Hart RochesterLawson  BP 125/87 mmHg  Pulse 64  Resp 18  Ht 5\' 10"  (1.778 m)  Wt 315 lb (142.883 kg)  BMI 45.20 kg/m2  Tumescent Anesthesia: 420 cc 0.9% NaCl with 50 cc Lidocaine HCL with 1% Epi and 15 cc 8.4% NaHCO3  Local Anesthesia: 8 cc Lidocaine HCL and NaHCO3 (ratio 2:1)  Pulsed Mode: 15 watts, 500ms delay, 1.0 duration  Total Energy1729:              Total Pulses:  116              Total Time:  1:55   Stab Phlebectomy: 10-20 Sites: Thigh  Patient tolerated procedure well  Notes:   Description of Procedure:  After marking the course of the secondary varicosities, the patient was placed on the operating table in the supine position, and the left leg was prepped and draped in sterile fashion.   Local anesthetic was administered and under ultrasound guidance the saphenous vein was accessed with a micro needle and guide wire; then the mirco puncture sheath was placed.  A guide wire was inserted saphenofemoral junction , followed by a 5 french sheath.  The position of the sheath and then the laser fiber below the junction was confirmed using the ultrasound.  Tumescent anesthesia was administered along the course of the saphenous vein using ultrasound guidance. The patient was placed in Trendelenburg position and protective laser glasses were placed on patient and staff, and the laser was fired at 15 watts continuous mode advancing 1-792mm/second for a total of 1729 joules.   For stab phlebectomies, local anesthetic was administered at the previously marked varicosities, and tumescent anesthesia was administered around the vessels.  Ten to 20 stab wounds were made using the tip of an 11 blade. And using the vein hook, the phlebectomies were performed using a hemostat to avulse the varicosities.  Adequate  hemostasis was achieved.     Steri strips were applied to the stab wounds and ABD pads and thigh high compression stockings were applied.  Ace wrap bandages were applied over the phlebectomy sites and at the top of the saphenofemoral junction. Blood loss was less than 15 cc.  The patient ambulated out of the operating room having tolerated the procedure well.

## 2015-02-28 ENCOUNTER — Telehealth: Payer: Self-pay | Admitting: *Deleted

## 2015-02-28 NOTE — Telephone Encounter (Signed)
Left a voicemail for him to call me if he is having any issues.

## 2015-03-01 ENCOUNTER — Encounter: Payer: Self-pay | Admitting: Vascular Surgery

## 2015-03-01 ENCOUNTER — Telehealth: Payer: Self-pay | Admitting: *Deleted

## 2015-03-01 NOTE — Telephone Encounter (Signed)
Pt called in saying he is having a lot of pain in the leg where the laser was performed and also swelling at his groin (testicular). He wanted to know if this was normal. I assured him that it was. He denied foot and ankle swelling. He says he is taking Ibuprofen 3 200 mg tabs TID. I suggested he take it easy today and continue to apply ice. I suggested heat tomorrow and to try to increase his activity as tolerated tomorrow. The pt expressed understanding and seemed reassured. We will see him on 7/5 for his fu lab and appt.

## 2015-03-02 ENCOUNTER — Telehealth: Payer: Self-pay | Admitting: *Deleted

## 2015-03-02 NOTE — Telephone Encounter (Signed)
Mr. Christopher Bender is s/p 02-27-2015 endovenous laser ablation left greater saphenous vein and stab phlebectomy 10-20 incisions left leg by Betti CruzJD Lawson, MD.  Mr. Christopher Bender states his left leg is very bruised and he is having moderate/severe pain along left inner thigh when walking, sitting, or standing.  He denies left foot or ankle swelling or pain.  Reassured Mr. Christopher Bender that bruising and pain over the area treated (left inner thigh) is within normal limits.  Explained that the laser ablation procedure produces a large inflammatory process that can result in pain and discomfort.  Encouraged Mr. Christopher Bender to continue wearing his compression hose, using ice compress to left inner thigh, and keeping left leg elevated.  Suggested that he increase Ibuprofen dosage to 800 mg three times daily with meals (Mr. Christopher Bender weighs 310 lb).  Conferred with Clementeen HoofLiz Wert RN (Dr. Candie ChromanLawson's nurse) by telephone who agreed with this course of treatment.  Requested that he call [facility] if his symptoms worsened or if he had further questions or concerns. Informed him I would be in office and available tomorrow from 9A-1P and that a [facility] physician would be on call over the holiday weekend. Informed him that if he developed left ankle or foot swelling and/or pain to call [facility] and if we were closed to go to the Emergency Room.

## 2015-03-07 ENCOUNTER — Encounter: Payer: Self-pay | Admitting: Vascular Surgery

## 2015-03-07 ENCOUNTER — Ambulatory Visit (HOSPITAL_COMMUNITY)
Admission: RE | Admit: 2015-03-07 | Discharge: 2015-03-07 | Disposition: A | Discharge status: Home / Self Care | Payer: BLUE CROSS/BLUE SHIELD | Source: Ambulatory Visit | Attending: Vascular Surgery | Admitting: Vascular Surgery

## 2015-03-07 ENCOUNTER — Ambulatory Visit (INDEPENDENT_AMBULATORY_CARE_PROVIDER_SITE_OTHER): Payer: Self-pay | Admitting: Vascular Surgery

## 2015-03-07 VITALS — BP 135/90 | HR 72 | Temp 98.4°F | Resp 18 | Ht 70.0 in | Wt 319.0 lb

## 2015-03-07 DIAGNOSIS — I83892 Varicose veins of left lower extremities with other complications: Secondary | ICD-10-CM | POA: Diagnosis not present

## 2015-03-07 NOTE — Progress Notes (Signed)
Subjective:     Patient ID: Christopher Bender, male   DOB: 12/18/1970, 44 y.o.   MRN: [id number]  HPI this 44 year old male returns 1 week post-laser ablation left great saphenous vein plus multiple stab phlebectomy of painful varicosities due to gross reflux in the left great saphenous system. He has had a moderate amount of discomfort in the medial thigh where the ablation procedure was performed. There was some bruising which is resolving. He's had no pain below the knee. He is having some difficulty wearing the long-leg stockings because of the size of his thigh.  Past Medical History  Diagnosis Date  . Essential hypertension, benign   . Depression   . Lumbar disc disease   . Varicose veins     History  Substance Use Topics  . Smoking status: Former Smoker -- 1.00 packs/day for 20 years    Types: Cigarettes    Start date: 09/02/1988    Quit date: 09/08/2008  . Smokeless tobacco: Never Used  . Alcohol Use: No    Family History  Problem Relation Age of Onset  . CAD Father   . Heart attack Father     Age 46  . Hypertension Mother     Allergies  Allergen Reactions  . Flexeril [Cyclobenzaprine] Other (See Comments)    REACTIONS: Headaches and flushing ("feels hot")     Current outpatient prescriptions:  .  citalopram (CELEXA) 20 MG tablet, Take 20 mg by mouth daily., Disp: , Rfl:  .  diazepam (VALIUM) 5 MG tablet, Take 1 tablet by mouth Twice daily as needed., Disp: , Rfl:  .  lisinopril (PRINIVIL,ZESTRIL) 20 MG tablet, Take 20 mg by mouth daily., Disp: , Rfl:  .  losartan (COZAAR) 50 MG tablet, Take 50 mg by mouth daily., Disp: , Rfl:  .  Menthol, Topical Analgesic, 7.5 % (ROLL) MISC, Apply 1 each topically daily as needed. For back pain, Disp: , Rfl:  .  oxyCODONE-acetaminophen (PERCOCET/ROXICET) 5-325 MG per tablet, Take 1 tablet by mouth Every 4 hours as needed., Disp: , Rfl:  .  Soft Lens Products (REWETTING DROPS) SOLN, 1 drop by Does not apply route daily as needed.,  Disp: , Rfl:  .  UNABLE TO FIND, Take 1 tablet by mouth 4 (four) times daily. Med Name: FIBRO-RESPONSE, Disp: , Rfl:  .  pregabalin (LYRICA) 150 MG capsule, Take 150 mg by mouth 2 (two) times daily., Disp: , Rfl:   Filed Vitals:   03/07/15 1229  BP: 135/90  Pulse: 72  Temp: 98.4 F (36.9 C)  TempSrc: Oral  Resp: 18  Height: 5\' 10"  (1.778 m)  Weight: 319 lb (144.697 kg)  SpO2: 100%    Body mass index is 45.77 kg/(m^2).         Review of Systems denies chest pain, dyspnea on exertion, PND, orthopnea, hemoptysis. Is due to have lumbar spine surgery in the very near future.     Objective:   Physical Exam BP 135/90 mmHg  Pulse 72  Temp(Src) 98.4 F (36.9 C) (Oral)  Resp 18  Ht 5\' 10"  (1.778 m)  Wt 319 lb (144.697 kg)  BMI 45.77 kg/m2  SpO2 100%  General obese male in no apparent distress alert and oriented 3  Lungs no rhonchi or wheezing Cardiovascular regular rhythm no murmurs Left leg with mild-to-moderate discomfort palpation of great saphenous vein from proximal inguinal area to near distal thigh. Resolving ecchymosis. No edema below the knee. Stab phlebectomy sites healing well. 3+ dorsalis pedis  pulse palpable.  Today ordered a venous duplex exam of the left leg which I reviewed and interpreted. There is no DVT. There is total closure of the left great saphenous vein up to near the saphenofemoral junction     Assessment:     Successful laser ablation left great saphenous vein with multiple stab phlebectomy of painful varicosities for gross reflux    Plan:     Return on a when necessary basis

## 2015-03-13 ENCOUNTER — Ambulatory Visit: Payer: BLUE CROSS/BLUE SHIELD | Admitting: Vascular Surgery

## 2015-03-13 ENCOUNTER — Encounter (HOSPITAL_COMMUNITY): Payer: BLUE CROSS/BLUE SHIELD

## 2016-11-17 ENCOUNTER — Emergency Department
Admission: EM | Admit: 2016-11-17 | Discharge: 2016-11-17 | Disposition: A | Discharge status: Home / Self Care | Payer: Self-pay | Attending: Emergency Medicine | Admitting: Emergency Medicine

## 2016-11-17 ENCOUNTER — Emergency Department: Payer: Self-pay

## 2016-11-17 ENCOUNTER — Encounter: Payer: Self-pay | Admitting: Emergency Medicine

## 2016-11-17 DIAGNOSIS — Z87891 Personal history of nicotine dependence: Secondary | ICD-10-CM | POA: Insufficient documentation

## 2016-11-17 DIAGNOSIS — I10 Essential (primary) hypertension: Secondary | ICD-10-CM | POA: Insufficient documentation

## 2016-11-17 DIAGNOSIS — J101 Influenza due to other identified influenza virus with other respiratory manifestations: Secondary | ICD-10-CM | POA: Insufficient documentation

## 2016-11-17 DIAGNOSIS — R1031 Right lower quadrant pain: Secondary | ICD-10-CM

## 2016-11-17 DIAGNOSIS — Z79899 Other long term (current) drug therapy: Secondary | ICD-10-CM | POA: Insufficient documentation

## 2016-11-17 DIAGNOSIS — R6510 Systemic inflammatory response syndrome (SIRS) of non-infectious origin without acute organ dysfunction: Secondary | ICD-10-CM | POA: Insufficient documentation

## 2016-11-17 LAB — INFLUENZA PANEL BY PCR (TYPE A & B)
Influenza A By PCR: NEGATIVE
Influenza B By PCR: POSITIVE — AB

## 2016-11-17 LAB — COMPREHENSIVE METABOLIC PANEL
ALBUMIN: 4.2 g/dL (ref 3.5–5.0)
ALT: 42 U/L (ref 17–63)
ANION GAP: 9 (ref 5–15)
AST: 30 U/L (ref 15–41)
Alkaline Phosphatase: 46 U/L (ref 38–126)
BUN: 14 mg/dL (ref 6–20)
CO2: 23 mmol/L (ref 22–32)
Calcium: 8.8 mg/dL — ABNORMAL LOW (ref 8.9–10.3)
Chloride: 98 mmol/L — ABNORMAL LOW (ref 101–111)
Creatinine, Ser: 1.41 mg/dL — ABNORMAL HIGH (ref 0.61–1.24)
GFR calc non Af Amer: 58 mL/min — ABNORMAL LOW (ref >60)
GLUCOSE: 123 mg/dL — AB (ref 65–99)
POTASSIUM: 3.4 mmol/L — AB (ref 3.5–5.1)
Sodium: 130 mmol/L — ABNORMAL LOW (ref 135–145)
Total Bilirubin: 1.2 mg/dL (ref 0.3–1.2)
Total Protein: 8.3 g/dL — ABNORMAL HIGH (ref 6.5–8.1)

## 2016-11-17 LAB — TROPONIN I: Troponin I: <0.03 ng/mL (ref <0.03)

## 2016-11-17 LAB — LACTIC ACID, PLASMA: Lactic Acid, Venous: 1.3 mmol/L (ref 0.5–1.9)

## 2016-11-17 LAB — CBC
HEMATOCRIT: 47.5 % (ref 40.0–52.0)
HEMOGLOBIN: 16.8 g/dL (ref 13.0–18.0)
MCH: 32 pg (ref 26.0–34.0)
MCHC: 35.3 g/dL (ref 32.0–36.0)
MCV: 90.6 fL (ref 80.0–100.0)
Platelets: 274 10*3/uL (ref 150–440)
RBC: 5.25 MIL/uL (ref 4.40–5.90)
RDW: 14.2 % (ref 11.5–14.5)
WBC: 13.1 10*3/uL — ABNORMAL HIGH (ref 3.8–10.6)

## 2016-11-17 LAB — LIPASE, BLOOD: LIPASE: 22 U/L (ref 11–51)

## 2016-11-17 MED ORDER — SODIUM CHLORIDE 0.9 % IV BOLUS (SEPSIS)
1000.0000 mL | Freq: Once | INTRAVENOUS | Status: AC
  Administered 2016-11-17: 1000 mL via INTRAVENOUS

## 2016-11-17 MED ORDER — DICYCLOMINE HCL 20 MG PO TABS: 20.0000 mg | ORAL_TABLET | Freq: Three times a day (TID) | ORAL | 0 refills | Status: DC | PRN

## 2016-11-17 MED ORDER — KETOROLAC TROMETHAMINE 30 MG/ML IJ SOLN
15.0000 mg | Freq: Once | INTRAMUSCULAR | Status: AC
  Administered 2016-11-17: 15 mg via INTRAVENOUS
  Filled 2016-11-17: qty 1

## 2016-11-17 MED ORDER — MORPHINE SULFATE (PF) 4 MG/ML IV SOLN
4.0000 mg | Freq: Once | INTRAVENOUS | Status: AC
  Administered 2016-11-17: 4 mg via INTRAVENOUS
  Filled 2016-11-17: qty 1

## 2016-11-17 MED ORDER — IOPAMIDOL (ISOVUE-300) INJECTION 61%
30.0000 mL | Freq: Once | INTRAVENOUS | Status: AC
  Administered 2016-11-17: 30 mL via ORAL

## 2016-11-17 MED ORDER — ONDANSETRON HCL 4 MG PO TABS: 4.0000 mg | ORAL_TABLET | Freq: Three times a day (TID) | ORAL | 0 refills | Status: DC | PRN

## 2016-11-17 MED ORDER — IBUPROFEN 600 MG PO TABS: 600.0000 mg | ORAL_TABLET | Freq: Four times a day (QID) | ORAL | 0 refills | Status: DC | PRN

## 2016-11-17 MED ORDER — DEXTROSE 5 % IV SOLN
1.0000 g | Freq: Once | INTRAVENOUS | Status: AC
  Administered 2016-11-17: 1 g via INTRAVENOUS
  Filled 2016-11-17: qty 10

## 2016-11-17 MED ORDER — ONDANSETRON HCL 4 MG/2ML IJ SOLN
4.0000 mg | Freq: Once | INTRAMUSCULAR | Status: AC
  Administered 2016-11-17: 4 mg via INTRAVENOUS
  Filled 2016-11-17: qty 2

## 2016-11-17 MED ORDER — IOPAMIDOL (ISOVUE-300) INJECTION 61%
125.0000 mL | Freq: Once | INTRAVENOUS | Status: AC | PRN
  Administered 2016-11-17: 125 mL via INTRAVENOUS

## 2016-11-17 MED ORDER — METRONIDAZOLE IN NACL 5-0.79 MG/ML-% IV SOLN
500.0000 mg | Freq: Once | INTRAVENOUS | Status: AC
  Administered 2016-11-17: 500 mg via INTRAVENOUS
  Filled 2016-11-17: qty 100

## 2016-11-17 NOTE — Discharge Instructions (Signed)
Please drink plenty of fluids and he can use supplemental Tylenol over-the-counter for fever. Advance her diet as tolerated. Return here especially for bloody diarrhea, increasing cough and shortness of breath, chest pain, or any other new concerns.  Please return immediately if condition worsens. Please contact her primary physician or the physician you were given for referral. If you have any specialist physicians involved in her treatment and plan please also contact them. Thank you for using [facility].

## 2016-11-17 NOTE — ED Notes (Signed)
Called to pt room. Pt states pain and nausea have returned. This RN relayed this information to Dr. Huel CoteQuigley. Dr. Huel CoteQuigley stated he wanted to have results of CT scan before ordering medications. Pt informed and verbalized understanding.

## 2016-11-17 NOTE — ED Provider Notes (Signed)
-----------------------------------------   9:32 AM on 11/17/2016 -----------------------------------------   Blood pressure 123/70, pulse 91, temperature 99.6 F (37.6 C), temperature source Oral, resp. rate (!) 22, height 5\' 10"  (1.778 m), weight (!) 340 lb (154.2 kg), SpO2 92 %.  Assuming care from Dr. Lamont Snowballifenbark.  In short, Christopher Bender is a 46 y.o. male with a chief complaint of Abdominal Pain; Nausea; Fever; and Diarrhea .  Refer to the original H&P for additional details.  The current plan of care is to  follow your results of the patient's studies.  "Ct Abdomen Pelvis W Contrast  Result Date: 11/17/2016 CLINICAL DATA:  Lower and abdomen pelvic pain for the past 4 days. EXAM: CT ABDOMEN AND PELVIS WITH CONTRAST TECHNIQUE: Multidetector CT imaging of the abdomen and pelvis was performed using the standard protocol following bolus administration of intravenous contrast. CONTRAST:  125mL ISOVUE-300 IOPAMIDOL (ISOVUE-300) INJECTION 61% COMPARISON:  04/05/2011. FINDINGS: Lower chest: Clear lung bases. Hepatobiliary: Mild diffuse low density of the liver relative to the spleen. Normal appearing gallbladder Pancreas: Unremarkable. No pancreatic ductal dilatation or surrounding inflammatory changes. Spleen: Normal in size without focal abnormality. Adrenals/Urinary Tract: Increased size of a right renal cyst, currently measuring 2.8 cm in maximum diameter. Normal left kidney, ureters, urinary bladder and adrenal glands. Stomach/Bowel: Concentric wall thickening and extensive pericolonic soft tissue stranding involving the mid sigmoid colon in an area of multiple diverticula. No fluid collections or free peritoneal air. Unremarkable stomach and small bowel. No evidence of appendicitis. Vascular/Lymphatic: No significant vascular findings are present. No enlarged abdominal or pelvic lymph nodes. Reproductive: Normal sized prostate gland. Other: Moderate size left inguinal hernia containing fat and small  right inguinal hernia containing fat. Small umbilical hernia containing fat. Musculoskeletal: Lumbar and lower thoracic spine degenerative changes. IMPRESSION: 1. Sigmoid diverticulosis and diverticulitis without abscess. 2. Mild diffuse hepatic steatosis. 3. Bilateral inguinal hernias containing fat and small umbilical hernia containing fat. Electronically Signed   By: Beckie SaltsSteven  Reid M.D.   On: 11/17/2016 09:17   Dg Chest Port 1 View  Result Date: 11/17/2016 CLINICAL DATA:  Nausea, diarrhea, fever and lower abdominal pain 5 days. EXAM: PORTABLE CHEST 1 VIEW COMPARISON:  06/12/2014 FINDINGS: Lungs are adequately inflated without consolidation or effusion. Cardiomediastinal silhouette, bones and soft tissues are within normal. IMPRESSION: No active disease. Electronically Signed   By: Elberta Fortisaniel  Boyle M.D.   On: 11/17/2016 07:11  " Laboratory work was reviewed and the patient was slightly dehydrated with a creatinine 1.41 and received IV fluids per my colleague. Patient's CAT scan did not show any findings of a surgical abdomen. His influenza B test was positive and he most likely is symptomatic with GI effects from the influenza virus. Patient will be discharged with a prescription of ibuprofen and take Tylenol as needed and advance his diet as tolerated.  Influenza B  Patient was advised to return immediately if condition worsens. Patient was advised to follow up with their primary care physician or other specialized physicians involved in their outpatient care. The patient and/or family member/power of attorney had laboratory results reviewed at the bedside. All questions and concerns were addressed and appropriate discharge instructions were distributed by the nursing staff.         Christopher MoccasinBrian S Deidrick Rainey, MD 11/17/16 [phone]

## 2016-11-17 NOTE — ED Provider Notes (Signed)
[facility] Emergency Department Provider Note  ____________________________________________   First MD Initiated Contact with Patient 11/17/16 [id number]     (approximate)  I have reviewed the triage vital signs and the nursing notes.   HISTORY  Chief Complaint Abdominal Pain; Nausea; Fever; and Diarrhea    HPI Christopher Bender is a 46 y.o. male who comes to the emergency department with 3 days of abdominal discomfort moderate severity, nausea, several episodes of diarrhea, and fever to 101 at home. He says he thinks he may have influenza. He's had some dry cough. He did not could've flu shot this year. He has no history of abdominal surgeries. He's had normal flatus. He denies chest pain or shortness of breath. His pain is moderate severity aching cramping right lower quadrant nonradiating nothing makes it better or worse. He has had a decreased appetite. He feels like he can't keep anything down.   Past Medical History:  Diagnosis Date  . Depression   . Essential hypertension, benign   . Lumbar disc disease   . Varicose veins     Patient Active Problem List   Diagnosis Date Noted  . Varicose veins of leg with complications 12/12/2014  . Varicose veins of lower extremities with other complications 05/02/2014  . Contracture of tendon (sheath) 06/16/2013  . Myofascial pain 06/16/2013  . Pain in joint, ankle and foot 06/16/2013  . Precordial pain 08/17/2012  . Abnormal ECG 08/17/2012  . Morbid obesity (HCC) 08/17/2012  . Paroxysmal spells 08/17/2012  . Essential hypertension, benign 08/16/2012    Past Surgical History:  Procedure Laterality Date  . FINGER SURGERY    . Right inguinal hernia repair      Prior to Admission medications   Medication Sig Start Date End Date Taking? Authorizing Provider  citalopram (CELEXA) 20 MG tablet Take 20 mg by mouth daily.    Historical Provider, MD  diazepam (VALIUM) 5 MG tablet Take 1 tablet by mouth Twice  daily as needed. 08/04/12   Historical Provider, MD  lisinopril (PRINIVIL,ZESTRIL) 20 MG tablet Take 20 mg by mouth daily.    Historical Provider, MD  losartan (COZAAR) 50 MG tablet Take 50 mg by mouth daily.    Historical Provider, MD  Menthol, Topical Analgesic, 7.5 % (ROLL) MISC Apply 1 each topically daily as needed. For back pain    Historical Provider, MD  oxyCODONE-acetaminophen (PERCOCET/ROXICET) 5-325 MG per tablet Take 1 tablet by mouth Every 4 hours as needed. 08/04/12   Historical Provider, MD  pregabalin (LYRICA) 150 MG capsule Take 150 mg by mouth 2 (two) times daily.    Historical Provider, MD  Soft Lens Products (REWETTING DROPS) SOLN 1 drop by Does not apply route daily as needed.    Historical Provider, MD  UNABLE TO FIND Take 1 tablet by mouth 4 (four) times daily. Med Name: FIBRO-RESPONSE    Historical Provider, MD    Allergies Flexeril [cyclobenzaprine]  Family History  Problem Relation Age of Onset  . CAD Father   . Heart attack Father     Age 48  . Hypertension Mother     Social History Social History  Substance Use Topics  . Smoking status: Former Smoker    Packs/day: 1.00    Years: 20.00    Types: Cigarettes    Start date: 09/02/1988    Quit date: 09/08/2008  . Smokeless tobacco: Never Used  . Alcohol use No    Review of Systems Constitutional: Positive fevers Eyes: No  visual changes. ENT: No sore throat. Cardiovascular: Denies chest pain. Respiratory: Denies shortness of breath. Gastrointestinal: Positive abdominal pain.  Positive nausea, no vomiting.  Positive diarrhea.  No constipation. Genitourinary: Negative for dysuria. Musculoskeletal: Negative for back pain. Skin: Negative for rash. Neurological: Negative for headaches, focal weakness or numbness.  10-point ROS otherwise negative.  ____________________________________________   PHYSICAL EXAM:  VITAL SIGNS: ED Triage Vitals [11/17/16 0614]  Enc Vitals Group     BP 110/76     Pulse  Rate (!) 132     Resp 18     Temp 99 F (37.2 C)     Temp Source Oral     SpO2 96 %     Weight (!) 340 lb (154.2 kg)     Height 5\' 10"  (1.778 m)     Head Circumference      Peak Flow      Pain Score 10     Pain Loc      Pain Edu?      Excl. in GC?     Constitutional: Alert and oriented x 4 Morbidly obese no respiratory distress appears uncomfortable Eyes: PERRL EOMI. Head: Atraumatic. Nose: No congestion/rhinnorhea. Mouth/Throat: No trismus Neck: No stridor.   Cardiovascular: Tachycardic rate, regular rhythm. Grossly normal heart sounds.  Good peripheral circulation. Respiratory: Normal respiratory effort.  No retractions. Lungs CTAB and moving good air Gastrointestinal: Soft nondistended significant tenderness over McBurney's with rebound and voluntary guarding. Negative Rovsing's Musculoskeletal: No lower extremity edema   Neurologic:  Normal speech and language. No gross focal neurologic deficits are appreciated. Skin:  Skin is warm, dry and intact. No rash noted. Psychiatric: Mood and affect are normal. Speech and behavior are normal.    ____________________________________________   DIFFERENTIAL appendicitis, diverticulitis, cholecystitis, pyelonephritis, influenza, pneumonia ____________________________________________   LABS (all labs ordered are listed, but only abnormal results are displayed)  Labs Reviewed  CULTURE, BLOOD (ROUTINE X 2)  CULTURE, BLOOD (ROUTINE X 2)  LIPASE, BLOOD  COMPREHENSIVE METABOLIC PANEL  CBC  URINALYSIS, COMPLETE (UACMP) WITH MICROSCOPIC  TROPONIN I  LACTIC ACID, PLASMA  LACTIC ACID, PLASMA  INFLUENZA PANEL BY PCR (TYPE A & B)     __________________________________________  EKG   ____________________________________________  RADIOLOGY   ____________________________________________   PROCEDURES  Procedure(s) performed: no  Procedures  Critical Care performed:  no  ____________________________________________   INITIAL IMPRESSION / ASSESSMENT AND PLAN / ED COURSE  Pertinent labs & imaging results that were available during my care of the patient were reviewed by me and considered in my medical decision making (see chart for details).  The patient is afebrile here but took Tylenol shortly before arrival. She is tachycardic to 130s with significant right lower quadrant pain and tenderness concerning for appendicitis. Line, labs, cultures, antibiotics now, and CT scan with IV contrast. Care signed out to the oncoming physician who will determine disposition after all studies done.      ____________________________________________   FINAL CLINICAL IMPRESSION(S) / ED DIAGNOSES  Final diagnoses:  Right lower quadrant abdominal pain  SIRS (systemic inflammatory response syndrome) (HCC)      NEW MEDICATIONS STARTED DURING THIS VISIT:  New Prescriptions   No medications on file     Note:  This document was prepared using Dragon voice recognition software and may include unintentional dictation errors.     Merrily Brittle, MD 11/17/16 [phone]

## 2016-11-17 NOTE — ED Notes (Signed)
Second blood culture drawn by April, RN

## 2016-11-17 NOTE — ED Triage Notes (Signed)
Patient with complaint of nausea, diarrhea, fever and lower abdominal pain that started on Wednesday.

## 2018-05-12 DIAGNOSIS — F331 Major depressive disorder, recurrent, moderate: Secondary | ICD-10-CM | POA: Insufficient documentation

## 2018-05-14 DIAGNOSIS — M797 Fibromyalgia: Secondary | ICD-10-CM | POA: Insufficient documentation

## 2018-06-11 IMAGING — CT CT ABD-PELV W/ CM
2 of 5 series · 17 of 46 positions shown, 19 images · IV contrast (iopamidol)
Comparison: 04/05/2011.

CLINICAL DATA: Lower and abdomen pelvic pain for the past 4 days.

EXAM:
CT ABDOMEN AND PELVIS WITH CONTRAST
TECHNIQUE: Multidetector CT imaging of the abdomen and pelvis was performed
using the standard protocol following bolus administration of
intravenous contrast.
CONTRAST:  125mL P6RJUC-Y00 IOPAMIDOL (P6RJUC-Y00) INJECTION 61%

[Series 2: routine abd/pel with · axial · 0.98mm/px · z∈[-881,-416]mm · 14 of 105 slices shown, 16 images]
[im 6/105  soft-tissue]
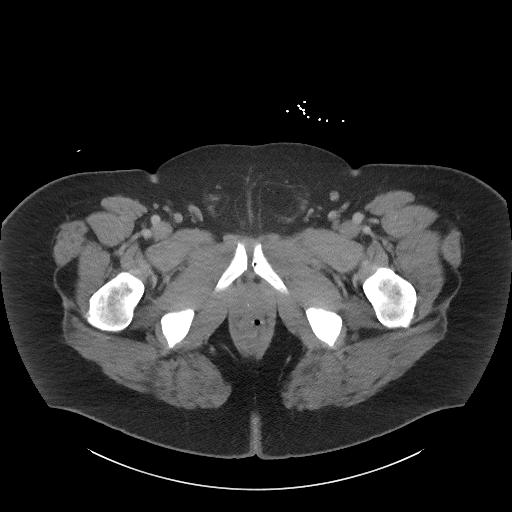
[im 6/105  bone]
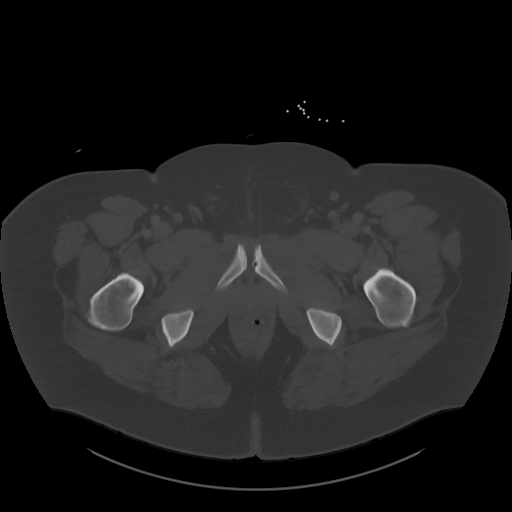
[im 12/105  soft-tissue]
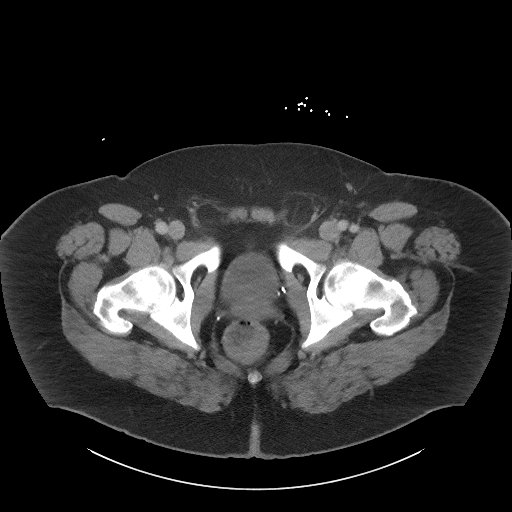
[im 24/105  soft-tissue]
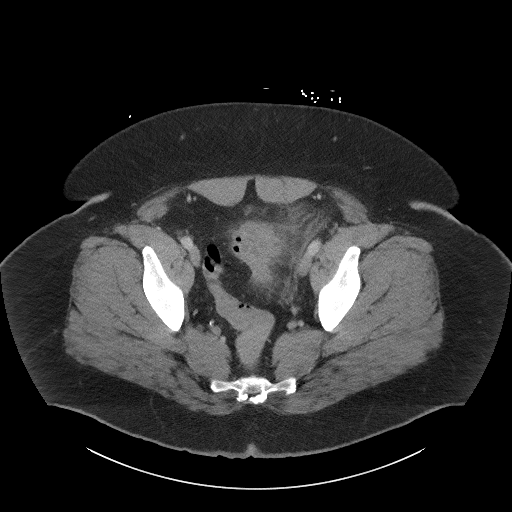
[im 29/105  soft-tissue]
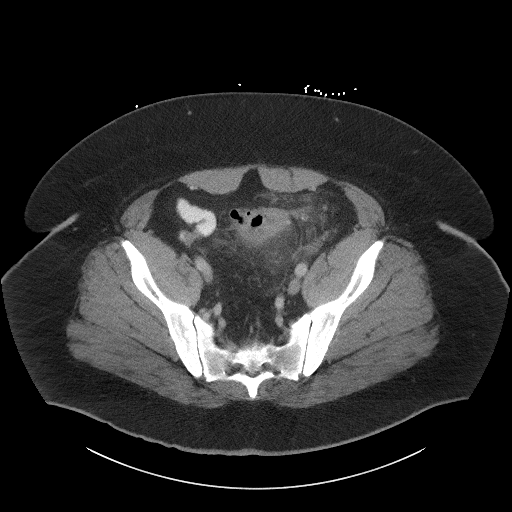
[im 35/105  soft-tissue]
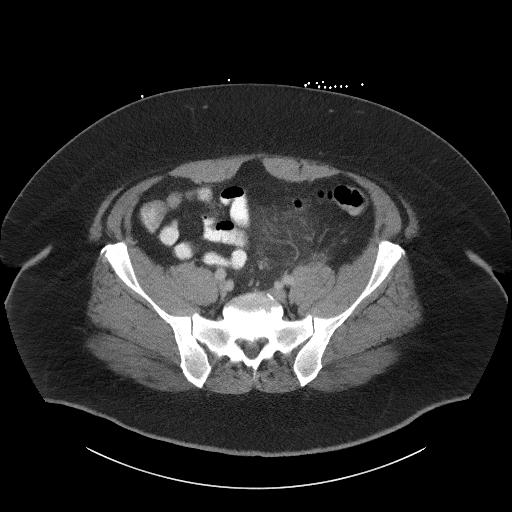
[im 41/105  soft-tissue]
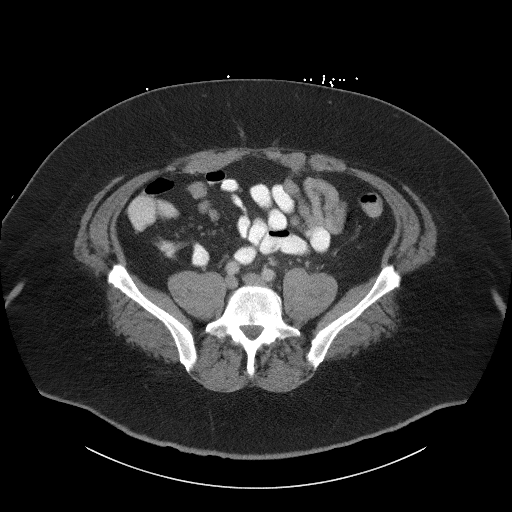
[im 47/105  soft-tissue]
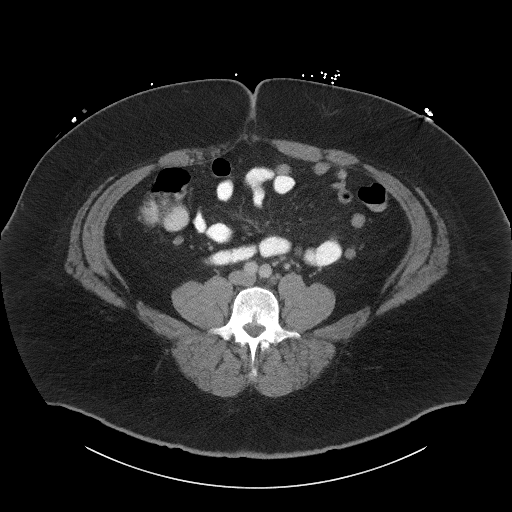
[im 58/105  soft-tissue]
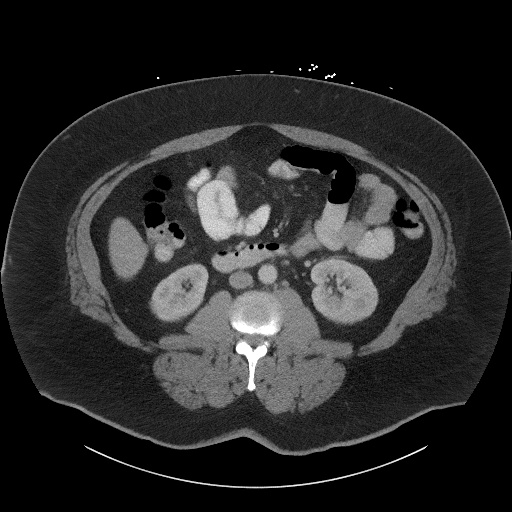
[im 64/105  soft-tissue]
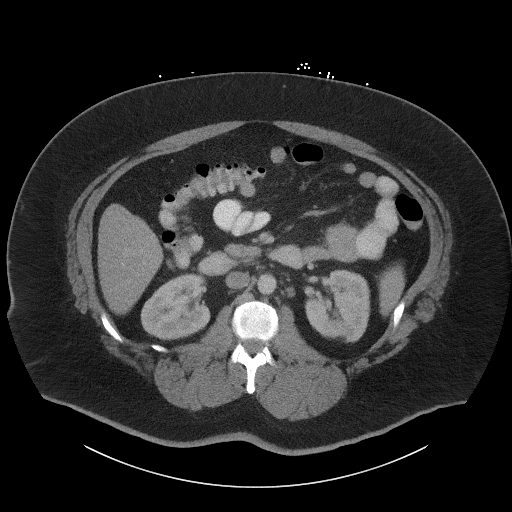
[im 64/105  bone]
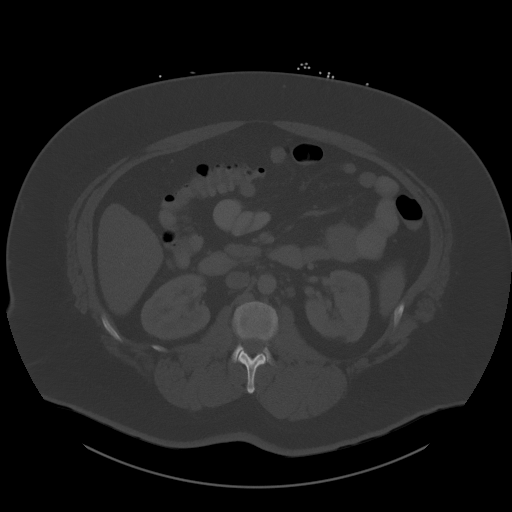
[im 70/105  soft-tissue]
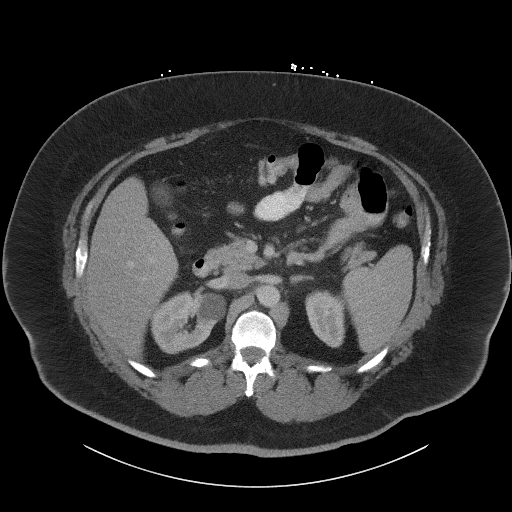
[im 76/105  soft-tissue]
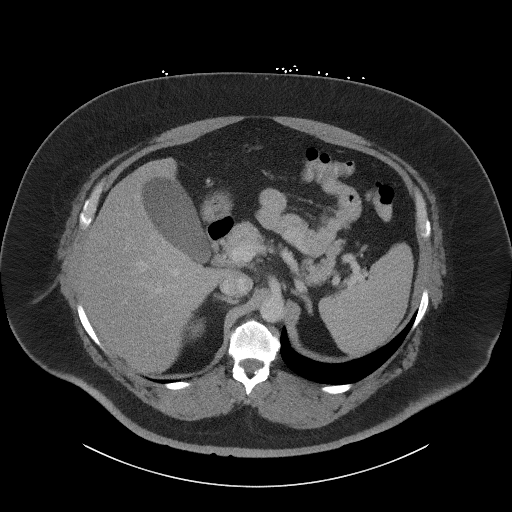
[im 81/105  soft-tissue]
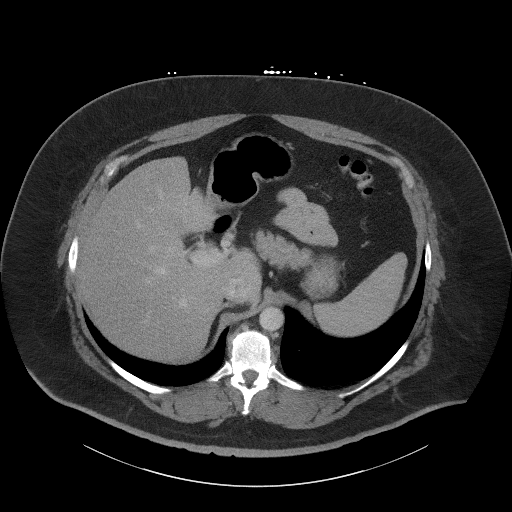
[im 93/105  soft-tissue]
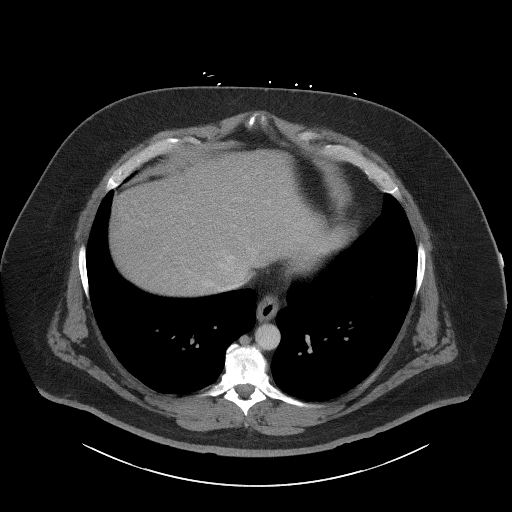
[im 99/105  soft-tissue]
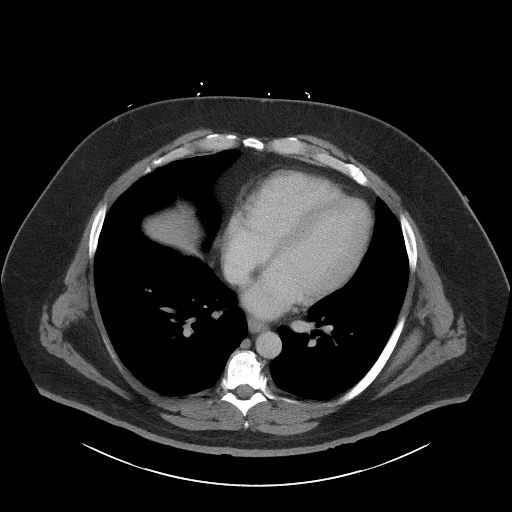

[Series 5: coronal st · coronal · 0.95mm/px · 3 of 123 slices shown]
[im 41/123  soft-tissue]
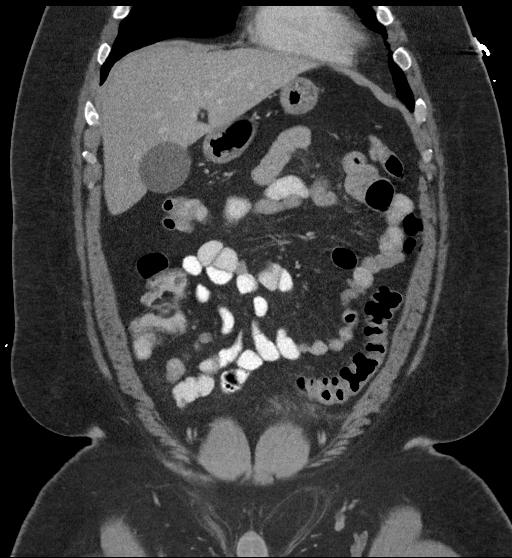
[im 55/123  soft-tissue]
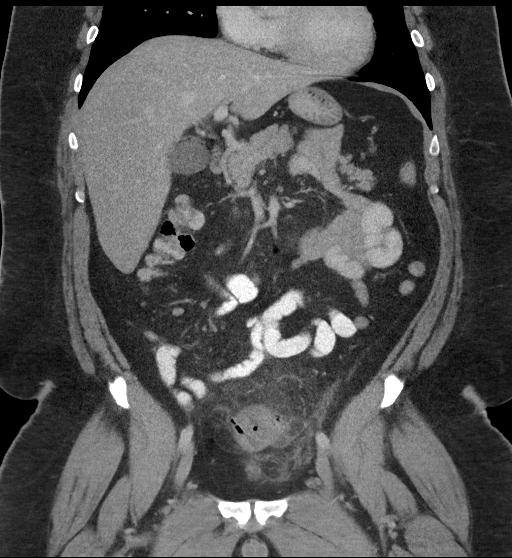
[im 68/123  soft-tissue]
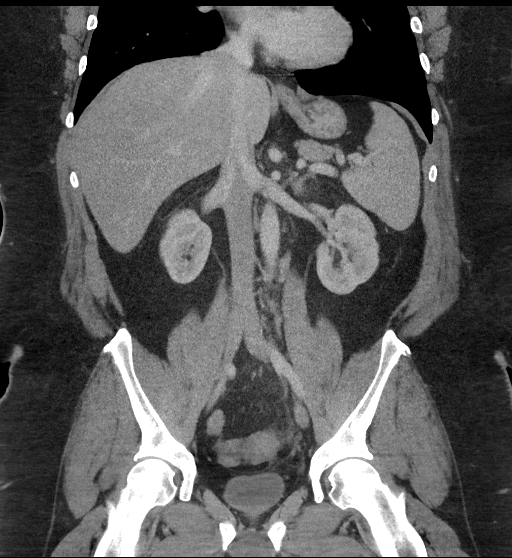

[17 of 46 positions shown; findings below may reference images not displayed]

FINDINGS: Lower chest: Clear lung bases.

Hepatobiliary: Mild diffuse low density of the liver relative to the
spleen. Normal appearing gallbladder

Pancreas: Unremarkable. No pancreatic ductal dilatation or
surrounding inflammatory changes.

Spleen: Normal in size without focal abnormality.

Adrenals/Urinary Tract: Increased size of a right renal cyst,
currently measuring 2.8 cm in maximum diameter. Normal left kidney,
ureters, urinary bladder and adrenal glands.

Stomach/Bowel: Concentric wall thickening and extensive pericolonic
soft tissue stranding involving the mid sigmoid colon in an area of
multiple diverticula. No fluid collections or free peritoneal air.
Unremarkable stomach and small bowel. No evidence of appendicitis.

Vascular/Lymphatic: No significant vascular findings are present. No
enlarged abdominal or pelvic lymph nodes.

Reproductive: Normal sized prostate gland.

Other: Moderate size left inguinal hernia containing fat and small
right inguinal hernia containing fat. Small umbilical hernia
containing fat.

Musculoskeletal: Lumbar and lower thoracic spine degenerative
changes.
IMPRESSION: 1. Sigmoid diverticulosis and diverticulitis without abscess.
2. Mild diffuse hepatic steatosis.
3. Bilateral inguinal hernias containing fat and small umbilical
hernia containing fat.

## 2018-11-09 DIAGNOSIS — G4733 Obstructive sleep apnea (adult) (pediatric): Secondary | ICD-10-CM | POA: Insufficient documentation

## 2023-06-02 ENCOUNTER — Other Ambulatory Visit: Payer: Self-pay

## 2023-06-02 ENCOUNTER — Emergency Department (HOSPITAL_COMMUNITY)
Admission: EM | Admit: 2023-06-02 | Discharge: 2023-06-03 | Disposition: A | Discharge status: Home / Self Care | Payer: BLUE CROSS/BLUE SHIELD | Attending: Emergency Medicine | Admitting: Emergency Medicine

## 2023-06-02 DIAGNOSIS — M5432 Sciatica, left side: Secondary | ICD-10-CM

## 2023-06-02 DIAGNOSIS — M5442 Lumbago with sciatica, left side: Secondary | ICD-10-CM | POA: Insufficient documentation

## 2023-06-02 DIAGNOSIS — M25562 Pain in left knee: Secondary | ICD-10-CM | POA: Insufficient documentation

## 2023-06-02 NOTE — ED Triage Notes (Signed)
Pt reports x 3 days left knee pain, ice, rest and NSAID'S with no relief , pt denies injury/ trauma.

## 2023-06-03 ENCOUNTER — Emergency Department (HOSPITAL_COMMUNITY): Payer: BLUE CROSS/BLUE SHIELD

## 2023-06-03 MED ORDER — METHYLPREDNISOLONE 4 MG PO TBPK: ORAL_TABLET | ORAL | 0 refills | Status: DC

## 2023-06-03 MED ORDER — DEXAMETHASONE SODIUM PHOSPHATE 10 MG/ML IJ SOLN
10.0000 mg | Freq: Once | INTRAMUSCULAR | Status: AC
  Administered 2023-06-03: 10 mg via INTRAMUSCULAR
  Filled 2023-06-03: qty 1

## 2023-06-03 MED ORDER — OXYCODONE-ACETAMINOPHEN 5-325 MG PO TABS
1.0000 | ORAL_TABLET | ORAL | 0 refills | Status: DC | PRN
Start: 2023-06-03 — End: 2023-11-13

## 2023-06-03 MED ORDER — KETOROLAC TROMETHAMINE 60 MG/2ML IM SOLN
60.0000 mg | Freq: Once | INTRAMUSCULAR | Status: AC
  Administered 2023-06-03: 60 mg via INTRAMUSCULAR
  Filled 2023-06-03: qty 2

## 2023-06-03 NOTE — ED Provider Notes (Signed)
[facility] EMERGENCY DEPARTMENT AT [facility] Provider Note   CSN: [id number] Arrival date & time: 06/02/23  1912     History  Chief Complaint  Patient presents with   Knee Pain    Christopher Bender is a 52 y.o. male.  Patient presents to the emergency department for evaluation of left knee pain.  Patient reports that the pain began 3 days ago.  He has not injured himself.  He has been taking NSAIDs and icing it.  Today he ran a lot of errands and over the course of the day pain worsened.  Patient now complaining of severe pain if he tries to stand on his leg.  Pain is now a burning pain up into the thigh.  He reports a history of chronic low back problems with bulging disks.  He has had spinal injections over the years but has not needed any specific back treatments for about 8 years.  No loss of strength or sensation in lower extremities, no change in bowel or bladder function.       Home Medications Prior to Admission medications   Medication Sig Start Date End Date Taking? Authorizing Provider  methylPREDNISolone (MEDROL DOSEPAK) 4 MG TBPK tablet As directed 06/03/23  Yes Erikka Follmer, Canary Brim, MD  oxyCODONE-acetaminophen (PERCOCET) 5-325 MG tablet Take 1-2 tablets by mouth every 4 (four) hours as needed. 06/03/23  Yes Aldora Perman, Canary Brim, MD  citalopram (CELEXA) 20 MG tablet Take 20 mg by mouth daily.    [provider]  diazepam (VALIUM) 5 MG tablet Take 1 tablet by mouth Twice daily as needed. 08/04/12   [provider]  dicyclomine (BENTYL) 20 MG tablet Take 1 tablet (20 mg total) by mouth 3 (three) times daily as needed for spasms. 11/17/16   Jennye Moccasin, MD  ibuprofen (ADVIL,MOTRIN) 600 MG tablet Take 1 tablet (600 mg total) by mouth every 6 (six) hours as needed. 11/17/16   Jennye Moccasin, MD  lisinopril (PRINIVIL,ZESTRIL) 20 MG tablet Take 20 mg by mouth daily.    [provider]  losartan (COZAAR) 50 MG tablet Take 50 mg by mouth  daily.    [provider]  Menthol, Topical Analgesic, 7.5 % (ROLL) MISC Apply 1 each topically daily as needed. For back pain    [provider]  ondansetron (ZOFRAN) 4 MG tablet Take 1 tablet (4 mg total) by mouth every 8 (eight) hours as needed for nausea or vomiting. 11/17/16   Jennye Moccasin, MD  pregabalin (LYRICA) 150 MG capsule Take 150 mg by mouth 2 (two) times daily.    [provider]  Soft Lens Products (REWETTING DROPS) SOLN 1 drop by Does not apply route daily as needed.    [provider]  UNABLE TO FIND Take 1 tablet by mouth 4 (four) times daily. Med Name: FIBRO-RESPONSE    [provider]      Allergies    Flexeril [cyclobenzaprine]    Review of Systems   Review of Systems  Physical Exam Updated Vital Signs BP (!) 173/100   Pulse 76   Temp 98.1 F (36.7 C)   Resp 20   SpO2 100%  Physical Exam Vitals and nursing note reviewed.  Constitutional:      General: He is not in acute distress.    Appearance: He is well-developed.  HENT:     Head: Normocephalic and atraumatic.     Mouth/Throat:     Mouth: Mucous membranes are moist.  Eyes:     General: Vision grossly intact. Gaze aligned appropriately.     Extraocular Movements: Extraocular movements intact.     Conjunctiva/sclera: Conjunctivae normal.  Cardiovascular:     Rate and Rhythm: Normal rate and regular rhythm.     Pulses: Normal pulses.     Heart sounds: Normal heart sounds, S1 normal and S2 normal. No murmur heard.    No friction rub. No gallop.  Pulmonary:     Effort: Pulmonary effort is normal. No respiratory distress.     Breath sounds: Normal breath sounds.  Abdominal:     Palpations: Abdomen is soft.     Tenderness: There is no abdominal tenderness. There is no guarding or rebound.     Hernia: No hernia is present.  Musculoskeletal:        General: No swelling.     Cervical back: Full passive range of motion without pain, normal range of motion and  neck supple. No pain with movement, spinous process tenderness or muscular tenderness. Normal range of motion.     Lumbar back: Tenderness present. Positive left straight leg raise test.     Left knee: No swelling, deformity, effusion, erythema or ecchymosis. Decreased range of motion.     Right lower leg: No edema.     Left lower leg: No edema.  Skin:    General: Skin is warm and dry.     Capillary Refill: Capillary refill takes less than 2 seconds.     Findings: No ecchymosis, erythema, lesion or wound.  Neurological:     Mental Status: He is alert and oriented to person, place, and time.     GCS: GCS eye subscore is 4. GCS verbal subscore is 5. GCS motor subscore is 6.     Cranial Nerves: Cranial nerves 2-12 are intact.     Sensory: Sensation is intact.     Motor: Motor function is intact. No weakness or abnormal muscle tone.     Coordination: Coordination is intact.  Psychiatric:        Mood and Affect: Mood normal.        Speech: Speech normal.        Behavior: Behavior normal.     ED Results / Procedures / Treatments   Labs (all labs ordered are listed, but only abnormal results are displayed) Labs Reviewed - No data to display  EKG None  Radiology DG Knee Complete 4 Views Left  Result Date: 06/03/2023 CLINICAL DATA:  Knee pain EXAM: LEFT KNEE - COMPLETE 4+ VIEW COMPARISON:  None Available. FINDINGS: No evidence of fracture, dislocation, or joint effusion. No evidence of arthropathy or other focal bone abnormality. Soft tissues are unremarkable. IMPRESSION: Negative. Electronically Signed   By: Charlett Nose M.D.   On: 06/03/2023 01:32    Procedures Procedures    Medications Ordered in ED Medications  ketorolac (TORADOL) injection 60 mg (has no administration in time range)  dexamethasone (DECADRON) injection 10 mg (has no administration in time range)    ED Course/ Medical Decision Making/ A&P                                 Medical Decision Making Amount  and/or Complexity of Data Reviewed Radiology: ordered.   Presents to the emergency department for evaluation of left knee pain for 3 days.  Pain worsening, now with pain in the left thigh.  Knee examination is fairly unremarkable.  No erythema, warmth, effusion.  Patient with minimal knee tenderness.  I suspect that this is referred pain.  Patient with history of L3-L4-L5 problems dating back many years.  Patient with positive straight leg raise on the left, pain all seems consistent with sciatic inflammatory problems.  No signs of acute neurosurgical emergency.  No foot drop, no saddle anesthesia, no urinary changes - no cauda equina syndrome.  Patient will be treated conservatively, follow-up with PCP.        Final Clinical Impression(s) / ED Diagnoses Final diagnoses:  Acute pain of left knee  Sciatica of left side    Rx / DC Orders ED Discharge Orders          Ordered    oxyCODONE-acetaminophen (PERCOCET) 5-325 MG tablet  Every 4 hours PRN        06/03/23 0320    methylPREDNISolone (MEDROL DOSEPAK) 4 MG TBPK tablet        06/03/23 0320              Gilda Crease, MD 06/03/23 0320

## 2023-11-13 ENCOUNTER — Emergency Department (HOSPITAL_COMMUNITY): Payer: Self-pay

## 2023-11-13 ENCOUNTER — Other Ambulatory Visit: Payer: Self-pay

## 2023-11-13 ENCOUNTER — Encounter (HOSPITAL_COMMUNITY): Payer: Self-pay

## 2023-11-13 ENCOUNTER — Emergency Department (HOSPITAL_COMMUNITY)
Admission: EM | Admit: 2023-11-13 | Discharge: 2023-11-13 | Disposition: A | Discharge status: Home / Self Care | Payer: Self-pay | Attending: Emergency Medicine | Admitting: Emergency Medicine

## 2023-11-13 DIAGNOSIS — Z79899 Other long term (current) drug therapy: Secondary | ICD-10-CM | POA: Insufficient documentation

## 2023-11-13 DIAGNOSIS — K5792 Diverticulitis of intestine, part unspecified, without perforation or abscess without bleeding: Secondary | ICD-10-CM

## 2023-11-13 DIAGNOSIS — K5730 Diverticulosis of large intestine without perforation or abscess without bleeding: Secondary | ICD-10-CM | POA: Insufficient documentation

## 2023-11-13 DIAGNOSIS — D72829 Elevated white blood cell count, unspecified: Secondary | ICD-10-CM | POA: Insufficient documentation

## 2023-11-13 DIAGNOSIS — R000 Tachycardia, unspecified: Secondary | ICD-10-CM | POA: Insufficient documentation

## 2023-11-13 LAB — CBC WITH DIFFERENTIAL/PLATELET
Abs Immature Granulocytes: 0.09 10*3/uL — ABNORMAL HIGH (ref 0.00–0.07)
Basophils Absolute: 0.1 10*3/uL (ref 0.0–0.1)
Basophils Relative: 1 %
Eosinophils Absolute: 0.1 10*3/uL (ref 0.0–0.5)
Eosinophils Relative: 1 %
HCT: 45.7 % (ref 39.0–52.0)
Hemoglobin: 16.5 g/dL (ref 13.0–17.0)
Immature Granulocytes: 1 %
Lymphocytes Relative: 22 %
Lymphs Abs: 3.6 10*3/uL (ref 0.7–4.0)
MCH: 31.7 pg (ref 26.0–34.0)
MCHC: 36.1 g/dL — ABNORMAL HIGH (ref 30.0–36.0)
MCV: 87.9 fL (ref 80.0–100.0)
Monocytes Absolute: 1.7 10*3/uL — ABNORMAL HIGH (ref 0.1–1.0)
Monocytes Relative: 11 %
Neutro Abs: 10.5 10*3/uL — ABNORMAL HIGH (ref 1.7–7.7)
Neutrophils Relative %: 64 %
Platelets: 422 10*3/uL — ABNORMAL HIGH (ref 150–400)
RBC: 5.2 MIL/uL (ref 4.22–5.81)
RDW: 12.7 % (ref 11.5–15.5)
WBC: 16.1 10*3/uL — ABNORMAL HIGH (ref 4.0–10.5)
nRBC: 0 % (ref 0.0–0.2)

## 2023-11-13 LAB — RESP PANEL BY RT-PCR (RSV, FLU A&B, COVID)  RVPGX2
Influenza A by PCR: NEGATIVE
Influenza B by PCR: NEGATIVE
Resp Syncytial Virus by PCR: NEGATIVE
SARS Coronavirus 2 by RT PCR: NEGATIVE

## 2023-11-13 LAB — COMPREHENSIVE METABOLIC PANEL
ALT: 17 U/L (ref 0–44)
AST: 15 U/L (ref 15–41)
Albumin: 3.7 g/dL (ref 3.5–5.0)
Alkaline Phosphatase: 66 U/L (ref 38–126)
Anion gap: 15 (ref 5–15)
BUN: 20 mg/dL (ref 6–20)
CO2: 22 mmol/L (ref 22–32)
Calcium: 9.6 mg/dL (ref 8.9–10.3)
Chloride: 92 mmol/L — ABNORMAL LOW (ref 98–111)
Creatinine, Ser: 1.21 mg/dL (ref 0.61–1.24)
GFR, Estimated: >60 mL/min (ref >60)
Glucose, Bld: 120 mg/dL — ABNORMAL HIGH (ref 70–99)
Potassium: 3 mmol/L — ABNORMAL LOW (ref 3.5–5.1)
Sodium: 129 mmol/L — ABNORMAL LOW (ref 135–145)
Total Bilirubin: 1.1 mg/dL (ref 0.0–1.2)
Total Protein: 8 g/dL (ref 6.5–8.1)

## 2023-11-13 LAB — LACTIC ACID, PLASMA: Lactic Acid, Venous: 0.9 mmol/L (ref 0.5–1.9)

## 2023-11-13 LAB — TROPONIN I (HIGH SENSITIVITY)
Troponin I (High Sensitivity): 4 ng/L (ref <18)
Troponin I (High Sensitivity): 4 ng/L (ref <18)

## 2023-11-13 LAB — LIPASE, BLOOD: Lipase: 39 U/L (ref 11–51)

## 2023-11-13 LAB — MAGNESIUM: Magnesium: 1.8 mg/dL (ref 1.7–2.4)

## 2023-11-13 MED ORDER — ONDANSETRON HCL 4 MG/2ML IJ SOLN
4.0000 mg | Freq: Once | INTRAMUSCULAR | Status: AC
  Administered 2023-11-13: 4 mg via INTRAVENOUS
  Filled 2023-11-13: qty 2

## 2023-11-13 MED ORDER — ONDANSETRON 4 MG PO TBDP: 4.0000 mg | ORAL_TABLET | Freq: Three times a day (TID) | ORAL | 0 refills | Status: DC | PRN

## 2023-11-13 MED ORDER — SODIUM CHLORIDE 0.9 % IV SOLN
2.0000 g | Freq: Once | INTRAVENOUS | Status: AC
  Administered 2023-11-13: 2 g via INTRAVENOUS
  Filled 2023-11-13: qty 20

## 2023-11-13 MED ORDER — POTASSIUM CHLORIDE CRYS ER 20 MEQ PO TBCR
40.0000 meq | EXTENDED_RELEASE_TABLET | Freq: Once | ORAL | Status: AC
  Administered 2023-11-13: 40 meq via ORAL
  Filled 2023-11-13: qty 2

## 2023-11-13 MED ORDER — POTASSIUM CHLORIDE CRYS ER 20 MEQ PO TBCR: 20.0000 meq | EXTENDED_RELEASE_TABLET | Freq: Every day | ORAL | 0 refills | Status: DC

## 2023-11-13 MED ORDER — LACTATED RINGERS IV BOLUS
2000.0000 mL | Freq: Once | INTRAVENOUS | Status: AC
  Administered 2023-11-13: 2000 mL via INTRAVENOUS

## 2023-11-13 MED ORDER — METRONIDAZOLE 500 MG/100ML IV SOLN
500.0000 mg | Freq: Once | INTRAVENOUS | Status: AC
  Administered 2023-11-13: 500 mg via INTRAVENOUS
  Filled 2023-11-13: qty 100

## 2023-11-13 MED ORDER — OXYCODONE-ACETAMINOPHEN 5-325 MG PO TABS
1.0000 | ORAL_TABLET | Freq: Once | ORAL | Status: AC
  Administered 2023-11-13: 1 via ORAL
  Filled 2023-11-13: qty 1

## 2023-11-13 MED ORDER — OXYCODONE-ACETAMINOPHEN 5-325 MG PO TABS: 1.0000 | ORAL_TABLET | Freq: Four times a day (QID) | ORAL | 0 refills | Status: DC | PRN

## 2023-11-13 MED ORDER — AMOXICILLIN-POT CLAVULANATE 875-125 MG PO TABS: 1.0000 | ORAL_TABLET | Freq: Two times a day (BID) | ORAL | 0 refills | Status: DC

## 2023-11-13 MED ORDER — FAMOTIDINE 20 MG PO TABS
40.0000 mg | ORAL_TABLET | Freq: Once | ORAL | Status: AC
  Administered 2023-11-13: 40 mg via ORAL
  Filled 2023-11-13: qty 2

## 2023-11-13 MED ORDER — IOHEXOL 300 MG/ML  SOLN
100.0000 mL | Freq: Once | INTRAMUSCULAR | Status: AC | PRN
  Administered 2023-11-13: 100 mL via INTRAVENOUS

## 2023-11-13 NOTE — ED Provider Notes (Signed)
 I was asked to follow-up on the patient's CT results and reassess for ultimate disposition but the plan initially was for admission.  The patient CT scan shows diverticulitis with no other acute findings.  I did discuss this with the patient.  He was still tachycardic and still having a lot of pain on reevaluation.  I did recommend admission to the hospital.  The patient does have decision-making capacity.  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  I did explain to the patient that worsening symptoms could lead to significant debility, surgeries, and worse cases death.  He does understand this and has decision-making capacity.  He is discharged with return precautions through shared decision making. Physical Exam  BP 122/67   Pulse (!) 103   Temp 99.1 F (37.3 C) (Oral)   Resp 20   Ht 5\' 10"  (1.778 m)   Wt (!) 154.5 kg   SpO2 93%   BMI 48.87 kg/m   Physical Exam General: No acute distress Cardio: Tachycardic Abdominal: Tender over the left lower quadrant Procedures  Procedures  ED Course / MDM   Clinical Course as of 11/13/23 2041  Thu Nov 13, 2023  1552 Signed out to Dr Rhae Hammock [RP]    Clinical Course User Index [RP] Rondel Baton, MD   Medical Decision Making Amount and/or Complexity of Data Reviewed Labs: ordered. Radiology: ordered.  Risk Prescription drug management.          Durwin Glaze, MD 11/13/23 2042

## 2023-11-13 NOTE — Discharge Instructions (Signed)
 I did recommend admission to the hospital but she ultimately decided to go home.  Please take the medications as prescribed.  Drink drink plenty of fluids.  Please start a fiber supplement such as Metamucil and take this daily.  Please follow-up with your doctor.  Return to the ER for worsening symptoms.

## 2023-11-13 NOTE — Progress Notes (Signed)
 Elink is following code sepsis.

## 2023-11-13 NOTE — ED Provider Notes (Signed)
 [place] EMERGENCY DEPARTMENT AT [facility] Provider Note   CSN: [id number] Arrival date & time: 11/13/23  1234     History  Chief Complaint  Patient presents with   Fever    Christopher Bender is a 53 y.o. male.  53 year old male who presents to the emergency department with with fevers, nausea, vomiting, diarrhea, and abdominal pain.  Has been present for the past 6 days.  Has been also having some left lower quadrant abdominal pain and think she may have a hernia.  Has been having off-and-on fevers with a Tmax of 102.9F.  Nausea and vomiting seem to have improved but reports that he still having the urge to have diarrhea.  Has been trying Tylenol at home.  Denies any runny nose, sore throat, or cough.  No abdominal surgeries.       Home Medications Prior to Admission medications   Medication Sig Start Date End Date Taking? Authorizing Provider  amoxicillin-clavulanate (AUGMENTIN) 875-125 MG tablet Take 1 tablet by mouth every 12 (twelve) hours. 11/13/23  Yes Durwin Glaze, MD  citalopram (CELEXA) 20 MG tablet Take 20 mg by mouth daily.   Yes [provider]  ibuprofen (ADVIL,MOTRIN) 600 MG tablet Take 1 tablet (600 mg total) by mouth every 6 (six) hours as needed. 11/17/16  Yes Jennye Moccasin, MD  lisinopril-hydrochlorothiazide (ZESTORETIC) 20-25 MG tablet Take 1 tablet by mouth daily. 06/08/21  Yes [provider]  ondansetron (ZOFRAN-ODT) 4 MG disintegrating tablet Take 1 tablet (4 mg total) by mouth every 8 (eight) hours as needed for nausea or vomiting. 11/13/23  Yes Durwin Glaze, MD  oxyCODONE-acetaminophen (PERCOCET/ROXICET) 5-325 MG tablet Take 1 tablet by mouth every 6 (six) hours as needed for severe pain (pain score 7-10). 11/13/23  Yes Durwin Glaze, MD  potassium chloride SA (KLOR-CON M) 20 MEQ tablet Take 1 tablet (20 mEq total) by mouth daily. 11/13/23  Yes Durwin Glaze, MD      Allergies    Flexeril [cyclobenzaprine]    Review of  Systems   Review of Systems  Physical Exam Updated Vital Signs BP 128/66 (BP Location: Right Arm)   Pulse 97   Temp 98.7 F (37.1 C) (Oral)   Resp 18   Ht 5\' 10"  (1.778 m)   Wt (!) 154.5 kg   SpO2 96%   BMI 48.87 kg/m  Physical Exam Vitals and nursing note reviewed.  Constitutional:      General: He is not in acute distress.    Appearance: He is well-developed.  HENT:     Head: Normocephalic and atraumatic.     Right Ear: External ear normal.     Left Ear: External ear normal.     Nose: Nose normal.  Eyes:     Extraocular Movements: Extraocular movements intact.     Conjunctiva/sclera: Conjunctivae normal.     Pupils: Pupils are equal, round, and reactive to light.  Cardiovascular:     Rate and Rhythm: Regular rhythm. Tachycardia present.     Heart sounds: Normal heart sounds.  Pulmonary:     Effort: Pulmonary effort is normal. No respiratory distress.     Breath sounds: Normal breath sounds.  Abdominal:     General: There is no distension.     Palpations: Abdomen is soft. There is no mass.     Tenderness: There is abdominal tenderness (Left lower quadrant.  Reducible inguinal hernia on the left side.). There is no guarding.  Musculoskeletal:  Cervical back: Normal range of motion and neck supple.     Right lower leg: No edema.     Left lower leg: No edema.  Skin:    General: Skin is warm and dry.  Neurological:     Mental Status: He is alert. Mental status is at baseline.  Psychiatric:        Mood and Affect: Mood normal.        Behavior: Behavior normal.     ED Results / Procedures / Treatments   Labs (all labs ordered are listed, but only abnormal results are displayed) Labs Reviewed  COMPREHENSIVE METABOLIC PANEL - Abnormal; Notable for the following components:      Result Value   Sodium 129 (*)    Potassium 3.0 (*)    Chloride 92 (*)    Glucose, Bld 120 (*)    All other components within normal limits  CBC WITH DIFFERENTIAL/PLATELET - Abnormal;  Notable for the following components:   WBC 16.1 (*)    MCHC 36.1 (*)    Platelets 422 (*)    Neutro Abs 10.5 (*)    Monocytes Absolute 1.7 (*)    Abs Immature Granulocytes 0.09 (*)    All other components within normal limits  RESP PANEL BY RT-PCR (RSV, FLU A&B, COVID)  RVPGX2  CULTURE, BLOOD (ROUTINE X 2)  CULTURE, BLOOD (ROUTINE X 2)  LIPASE, BLOOD  MAGNESIUM  LACTIC ACID, PLASMA  TROPONIN I (HIGH SENSITIVITY)  TROPONIN I (HIGH SENSITIVITY)    EKG EKG Interpretation Date/Time:  Thursday November 13 2023 13:16:46 EDT Ventricular Rate:  105 PR Interval:  156 QRS Duration:  111 QT Interval:  349 QTC Calculation: 462 R Axis:   0  Text Interpretation: Sinus tachycardia Baseline wander in lead(s) II III aVF V2 Confirmed by Vonita Moss [phone]) on 11/13/2023 2:48:10 PM  Radiology CT ABDOMEN PELVIS W CONTRAST Result Date: 11/13/2023 CLINICAL DATA:  Left lower quadrant abdominal pain. EXAM: CT ABDOMEN AND PELVIS WITH CONTRAST TECHNIQUE: Multidetector CT imaging of the abdomen and pelvis was performed using the standard protocol following bolus administration of intravenous contrast. RADIATION DOSE REDUCTION: This exam was performed according to the departmental dose-optimization program which includes automated exposure control, adjustment of the mA and/or kV according to patient size and/or use of iterative reconstruction technique. CONTRAST:  OMNIPAQUE IOHEXOL 300 MG/ML  SOLN COMPARISON:  CT abdomen pelvis dated 11/17/2016. FINDINGS: Lower chest: The visualized lung bases are clear. No intra-abdominal free air or free fluid. Hepatobiliary: Fatty liver. No biliary duct dilatation. The gallbladder is unremarkable Pancreas: Unremarkable. No pancreatic ductal dilatation or surrounding inflammatory changes. Spleen: Normal in size without focal abnormality. Adrenals/Urinary Tract: The adrenal glands unremarkable. Right renal upper pole cysts. There is no hydronephrosis on either side.  There is symmetric enhancement and excretion of contrast by both kidneys. The visualized ureters and urinary bladder appear unremarkable. Stomach/Bowel: There is sigmoid diverticulosis. There is focal area of stranding and inflammation adjacent to the sigmoid colon consistent with acute diverticulitis. No diverticular abscess or perforation. There is no bowel obstruction. The appendix is normal. Vascular/Lymphatic: The abdominal aorta and IVC unremarkable. No portal venous gas. There is no adenopathy. Reproductive: The prostate and seminal vesicles are grossly remarkable. Other: Small fat containing left inguinal hernia. Musculoskeletal: Degenerative changes of the spine. No acute osseous pathology. IMPRESSION: 1. Acute sigmoid diverticulitis. No diverticular abscess or perforation. 2. Fatty liver. Electronically Signed   By: Elgie Collard M.D.   On: 11/13/2023 17:52  DG Chest 2 View Result Date: 11/13/2023 CLINICAL DATA:  Shortness of breath. EXAM: CHEST - 2 VIEW COMPARISON:  November 17, 2016. FINDINGS: The heart size and mediastinal contours are within normal limits. Both lungs are clear. The visualized skeletal structures are unremarkable. IMPRESSION: No active cardiopulmonary disease. Electronically Signed   By: Lupita Raider M.D.   On: 11/13/2023 16:16    Procedures Procedures    Medications Ordered in ED Medications  lactated ringers bolus 2,000 mL (2,000 mLs Intravenous Bolus 11/13/23 1324)  potassium chloride SA (KLOR-CON M) CR tablet 40 mEq (40 mEq Oral Given 11/13/23 1418)  ondansetron (ZOFRAN) injection 4 mg (4 mg Intravenous Given 11/13/23 1419)  iohexol (OMNIPAQUE) 300 MG/ML solution 100 mL (100 mLs Intravenous Contrast Given 11/13/23 1505)  cefTRIAXone (ROCEPHIN) 2 g in sodium chloride 0.9 % 100 mL IVPB (0 g Intravenous Stopped 11/13/23 1704)  metroNIDAZOLE (FLAGYL) IVPB 500 mg (0 mg Intravenous Stopped 11/13/23 1738)  famotidine (PEPCID) tablet 40 mg (40 mg Oral Given 11/13/23 1635)     ED Course/ Medical Decision Making/ A&P Clinical Course as of 11/13/23 2121  Thu Nov 13, 2023  1552 Signed out to Dr Rhae Hammock [RP]    Clinical Course User Index [RP] Rondel Baton, MD                                 Medical Decision Making Amount and/or Complexity of Data Reviewed Labs: ordered. Radiology: ordered.  Risk Prescription drug management.   Christopher Bender is a 53 y.o. male with comorbidities that complicate the patient evaluation including fevers, nausea, vomiting, diarrhea, and left lower quadrant abdominal pain  Initial Ddx:  Diverticulitis, colitis, UTI, testicular torsion, kidney stone, pyelonephritis, sepsis  MDM:  Patient presents to the emergency department with abdominal pain, nausea, vomiting, and diarrhea.  Initially was concerned about possible gastroenteritis.  Also is having some tenderness palpation in his left lower quadrant.  Based on the patient's symptoms also feel diverticulitis is a possibility.  Will obtain CT scan which will show if they have colitis which could also be causing similar symptoms.  This will also show they have a kidney stone.  With the lower abdominal pain we will send off urinalysis to test for UTI and pyelonephritis.  Also considered testicular torsion but given the lack of testicular pain I feel this is less likely.    Plan:  Labs Urinalysis CT abdomen pelvis IV contrast Pain and nausea medication  ED Summary/Re-evaluation:  Lab work showed leukocytosis.  Patient was given IV fluids and started on antibiotics and culture sent with sepsis protocol initiated.  He is awaiting the final read of the CT scan but it appears to show diverticulitis on my read.  Signed out to the oncoming physician awaiting final CT results and disposition.  This patient presents to the ED for concern of complaints listed in HPI, this involves an extensive number of treatment options, and is a complaint that carries with it a high risk of  complications and morbidity. Disposition including potential need for admission considered.   Dispo: Pending remainder of workup  Records reviewed Outpatient Clinic Notes The following labs were independently interpreted: Chemistry and show no acute abnormality I independently reviewed the following imaging with scope of interpretation limited to determining acute life threatening conditions related to emergency care: CT Abdomen/Pelvis which shows diverticulitis I have reviewed the patients home medications and made adjustments as needed  Final Clinical Impression(s) / ED Diagnoses Final diagnoses:  Diverticulitis    Rx / DC Orders ED Discharge Orders          Ordered    amoxicillin-clavulanate (AUGMENTIN) 875-125 MG tablet  Every 12 hours        11/13/23 2044    ondansetron (ZOFRAN-ODT) 4 MG disintegrating tablet  Every 8 hours PRN        11/13/23 2044    oxyCODONE-acetaminophen (PERCOCET/ROXICET) 5-325 MG tablet  Every 6 hours PRN        11/13/23 2044    potassium chloride SA (KLOR-CON M) 20 MEQ tablet  Daily        11/13/23 2044              Rondel Baton, MD 11/13/23 2121

## 2023-11-13 NOTE — ED Triage Notes (Signed)
 Pt arrived via POV c/o fever at home 102.103F, N/V/D X 6 days. Pt endorses fatigue, body aches, diaphoresis and is tachycardic in Triage.

## 2023-11-14 LAB — BLOOD CULTURE ID PANEL (REFLEXED) - BCID2

## 2023-11-14 NOTE — ED Notes (Signed)
 11/14/2023 1449: Called and left secure vm for pt to call the hospital d/t some lab results at this time.

## 2023-11-16 LAB — CULTURE, BLOOD (ROUTINE X 2)
Special Requests: ADEQUATE
Special Requests: ADEQUATE

## 2023-11-17 ENCOUNTER — Telehealth (HOSPITAL_BASED_OUTPATIENT_CLINIC_OR_DEPARTMENT_OTHER): Payer: Self-pay

## 2023-11-17 NOTE — Telephone Encounter (Signed)
 Post ED Visit - Positive Culture Follow-up: Unsuccessful Patient Follow-up  Culture assessed and recommendations reviewed by:  [x]  Ruben Im, Pharm.D. []  Celedonio Miyamoto, [address].D., BCPS AQ-ID []  Garvin Fila, Pharm.D., BCPS []  Georgina Pillion, Pharm.D., BCPS []  [place], [address].D., BCPS, AAHIVP []  Estella Husk, Pharm.D., BCPS, AAHIVP []  Sherlynn Carbon, PharmD []  Pollyann Samples, PharmD, BCPS  Positive blood culture  []  Patient discharged without antimicrobial prescription and treatment is now indicated [x]  Organism is resistant to prescribed ED discharge antimicrobial []  Patient with positive blood cultures  Pt needs to be seen by provider  Unable to contact patient after 3 attempts, letter will be sent to address on file  Sandria Senter 11/17/2023, 12:06 PM

## 2024-04-07 ENCOUNTER — Ambulatory Visit: Payer: Self-pay | Admitting: *Deleted

## 2024-04-07 NOTE — Telephone Encounter (Signed)
 FYI Only or Action Required?: FYI only for provider.  Patient was last seen in primary care on no PCP  Called Nurse Triage reporting Hypertension.  Symptoms began about a month ago.  Interventions attempted: Nothing.  Symptoms are: gradually worsening.  Triage Disposition: Go to ED Now (Notify PCP)  Patient/caregiver understands and will follow disposition?: Unsure    Reason for Disposition  [1] Systolic BP >= 160 OR Diastolic >= 100 AND [2] cardiac (e.g., breathing difficulty, chest pain) or neurologic symptoms (e.g., new-onset blurred or double vision, unsteady gait)  Answer Assessment - Initial Assessment Questions 1. BLOOD PRESSURE: What is your blood pressure? Did you take at least two measurements 5 minutes apart?     165/110- last night, 167/123- P 70 2. ONSET: When did you take your blood pressure?     today 3. HOW: How did you take your blood pressure? (e.g., automatic home BP monitor, visiting nurse)     Automatic cuff- arm 4. HISTORY: Do you have a history of high blood pressure?     yes 5. MEDICINES: Are you taking any medicines for blood pressure? Have you missed any doses recently?     Lisinopril/hydrochlorothiazide- out of medication 1 month 6. OTHER SYMPTOMS: Do you have any symptoms? (e.g., blurred vision, chest pain, difficulty breathing, headache, weakness)     Headache Patient hesitant to follow recommendation due to no insurance- stressed importance of follow up and medication.  Protocols used: Blood Pressure - High-A-AH   Copied from CRM #[id number]. Topic: Clinical - Red Word Triage >> Apr 07, 2024 10:41 AM DeAngela L wrote: Red Word that prompted transfer to Nurse Triage: patient blood pressure is 165/110 last night sitting hile sitting still patient has continual headaches he is out of blood pressure medication lisinopril-hydrochlorothiazide (ZESTORETIC) 20-25 MG tablet mg 20-25 mg  and he has a new pt appt scheduled  Citalopram 40mg   tab  Pt num [phone] (M)

## 2024-04-08 ENCOUNTER — Telehealth: Payer: Self-pay | Admitting: Family Medicine

## 2024-04-08 NOTE — Telephone Encounter (Signed)
 Copied from CRM [id number]. Topic: Appointments - Appointment Info/Confirmation >> Apr 08, 2024 11:57 AM Montie POUR wrote: Patient/patient representative is calling for information regarding an appointment.   Christopher Bender is calling to see if he can get a sooner appointment because he is out of  lisinopril-hydrochlorothiazide (ZESTORETIC) 20-25 MG tablet and Citalopram 40mg  tab His blood pressure is going up and down. He was sent to triage nurse yesterday and was told to go to the emergency room. He said that he could afford to go to the emergency room due to not having insurance. Please call him at [phone] to discuss. Thanks

## 2024-04-08 NOTE — Telephone Encounter (Signed)
 Patient placed on waitlist and will be contacted if sooner appt becomes available.

## 2024-05-17 ENCOUNTER — Ambulatory Visit (INDEPENDENT_AMBULATORY_CARE_PROVIDER_SITE_OTHER): Payer: Self-pay | Admitting: Family Medicine

## 2024-05-17 ENCOUNTER — Encounter: Payer: Self-pay | Admitting: Family Medicine

## 2024-05-17 VITALS — BP 148/101 | HR 76 | Resp 16 | Ht 70.0 in | Wt 339.0 lb

## 2024-05-17 DIAGNOSIS — Z7689 Persons encountering health services in other specified circumstances: Secondary | ICD-10-CM

## 2024-05-17 DIAGNOSIS — G894 Chronic pain syndrome: Secondary | ICD-10-CM

## 2024-05-17 DIAGNOSIS — R5382 Chronic fatigue, unspecified: Secondary | ICD-10-CM

## 2024-05-17 DIAGNOSIS — F331 Major depressive disorder, recurrent, moderate: Secondary | ICD-10-CM

## 2024-05-17 DIAGNOSIS — I10 Essential (primary) hypertension: Secondary | ICD-10-CM

## 2024-05-17 DIAGNOSIS — W57XXXS Bitten or stung by nonvenomous insect and other nonvenomous arthropods, sequela: Secondary | ICD-10-CM

## 2024-05-17 DIAGNOSIS — F419 Anxiety disorder, unspecified: Secondary | ICD-10-CM

## 2024-05-17 MED ORDER — CITALOPRAM HYDROBROMIDE 20 MG PO TABS: ORAL_TABLET | ORAL | 3 refills | Status: AC

## 2024-05-17 MED ORDER — LISINOPRIL-HYDROCHLOROTHIAZIDE 20-25 MG PO TABS: 1.0000 | ORAL_TABLET | Freq: Every day | ORAL | 3 refills | Status: AC

## 2024-05-17 NOTE — Patient Instructions (Signed)

## 2024-05-17 NOTE — Progress Notes (Signed)
 New Patient Office Visit  Subjective   Patient ID: Christopher Bender, male    DOB: 17-Oct-1970  Age: 53 y.o. MRN: [id number]  CC:  Chief Complaint  Patient presents with   Establish Care   Hypertension   Anxiety   Discussed the use of AI scribe software for clinical note transcription with the patient, who gave verbal consent to proceed.  History of Present Illness   Christopher Bender is a 53 year old male who presents to establish with [facility] at [facility]. He has a history of hypertension, anxiety, and depression who presents to establish care after being off medications for four months.  He has been off his medications, including lisinopril -hctz and Celexa , for approximately four months following the death of his mother on 12/11/2023. He reports blood pressure readings as high as 186/118 and states he has been unable to donate plasma due to high blood pressure. He reports previously taking lisinopril -hctz 20-25 mg daily, which he describes as his 'miracle' medication.  He reports a significant increase in weight, gaining approximately 42 pounds in the last four months, which he attributes to emotional eating following his mother's death. He denies alcohol, tobacco, or drug use, except for occasional marijuana use, which he has not engaged in for four months. He describes himself as a 'milkaholic,' consuming a significant amount of skim milk daily.  He has a family history of depression on his mother's side, with multiple family members affected. He was first prescribed Paxil at age 66 and has been on Celexa , which he finds effective when taken regularly. He reports a history of fibromyalgia diagnosed at age 55, with symptoms worsening in the past few months. He describes feeling 'like a [age]-year-old,' with generalized pain and fatigue, and reports that after lying down, he can barely move.  He was bitten by a tick approximately four months ago and is concerned about Lyme  disease, reporting feeling 'awful' since the bite. He experiences significant fatigue and pain, describing it as feeling 'poisoned.' He has a history of a heart attack discovered incidentally four years ago, with no acute symptoms at the time. He has a family history of heart attacks on his biological father's side.  He recently had COVID-19 about a month ago and reports persistent headaches over the past month, for which he has been taking Aleve daily. He has a history of flu-like symptoms annually, regardless of receiving the flu shot. He expresses concern about potential insulin resistance and its impact on his health, noting a family history of heart disease but no known kidney issues.          05/17/2024    8:27 AM  GAD 7 : Generalized Anxiety Score  Nervous, Anxious, on Edge 2  Control/stop worrying 3  Worry too much - different things 2  Trouble relaxing 3  Restless 2  Easily annoyed or irritable 2  Afraid - awful might happen 1  Total GAD 7 Score 15  Anxiety Difficulty Somewhat difficult       05/17/2024    8:27 AM  PHQ9 SCORE ONLY  PHQ-9 Total Score 16     Outpatient Encounter Medications as of 05/17/2024  Medication Sig   citalopram  (CELEXA ) 20 MG tablet Take 1 tablet (20mg ) citalopram  once daily for 14 days. Take 2 tablets (40mg ) citalopram  once daily.   [DISCONTINUED] citalopram  (CELEXA ) 20 MG tablet Take 20 mg by mouth daily.   [DISCONTINUED] citalopram  (CELEXA ) 40 MG tablet Take 40  mg by mouth daily.   [DISCONTINUED] lisinopril -hydrochlorothiazide  (ZESTORETIC ) 20-25 MG tablet Take 1 tablet by mouth daily.   lisinopril -hydrochlorothiazide  (ZESTORETIC ) 20-25 MG tablet Take 1 tablet by mouth daily.   [DISCONTINUED] amoxicillin -clavulanate (AUGMENTIN ) 875-125 MG tablet Take 1 tablet by mouth every 12 (twelve) hours.   [DISCONTINUED] ibuprofen  (ADVIL ,MOTRIN ) 600 MG tablet Take 1 tablet (600 mg total) by mouth every 6 (six) hours as needed.   [DISCONTINUED] ondansetron   (ZOFRAN -ODT) 4 MG disintegrating tablet Take 1 tablet (4 mg total) by mouth every 8 (eight) hours as needed for nausea or vomiting.   [DISCONTINUED] oxyCODONE -acetaminophen  (PERCOCET/ROXICET) 5-325 MG tablet Take 1 tablet by mouth every 6 (six) hours as needed for severe pain (pain score 7-10).   [DISCONTINUED] potassium chloride  SA (KLOR-CON  M) 20 MEQ tablet Take 1 tablet (20 mEq total) by mouth daily.   No facility-administered encounter medications on file as of 05/17/2024.    Patient Active Problem List   Diagnosis Date Noted   OSA on CPAP 11/09/2018   Fibromyalgia 05/14/2018   Moderate episode of recurrent major depressive disorder (HCC) 05/12/2018   Varicose veins of leg with complications 12/12/2014   Varicose veins of bilateral lower extremities with other complications 05/02/2014   Varicose veins of left lower extremity with other complications 05/02/2014   Contracture of tendon sheath 06/16/2013   Myofascial pain 06/16/2013   Pain in joint, ankle and foot 06/16/2013   Precordial pain 08/17/2012   Abnormal ECG 08/17/2012   Morbid obesity (HCC) 08/17/2012   Paroxysmal spells 08/17/2012   Essential hypertension, benign 08/16/2012   Past Medical History:  Diagnosis Date   Depression    Essential hypertension, benign    Lumbar disc disease    Varicose veins    Past Surgical History:  Procedure Laterality Date   FINGER SURGERY     Right inguinal hernia repair     Family History  Problem Relation Age of Onset   Hypertension Mother    Depression Mother    CAD Father    Heart attack Father        Age 58   Kidney disease Father        not biological   Social History   Socioeconomic History   Marital status: Divorced    Spouse name: Not on file   Number of children: Not on file   Years of education: Not on file   Highest education level: Not on file  Occupational History   Not on file  Tobacco Use   Smoking status: Former    Current packs/day: 0.00    Average  packs/day: 1 pack/day for 20.0 years (20.0 ttl pk-yrs)    Types: Cigarettes    Start date: 09/02/1988    Quit date: 09/08/2008    Years since quitting: 15.6   Smokeless tobacco: Never  Vaping Use   Vaping status: Never Used  Substance and Sexual Activity   Alcohol use: No    Alcohol/week: 0.0 standard drinks of alcohol   Drug use: No   Sexual activity: Not on file  Other Topics Concern   Not on file  Social History Narrative   Not on file   Social Drivers of Health   Financial Resource Strain: Not on file  Food Insecurity: Not on file  Transportation Needs: Not on file  Physical Activity: Not on file  Stress: Not on file  Social Connections: Not on file  Intimate Partner Violence: Not on file   Outpatient Medications Prior to Visit  Medication  Sig Dispense Refill   citalopram  (CELEXA ) 20 MG tablet Take 20 mg by mouth daily.     citalopram  (CELEXA ) 40 MG tablet Take 40 mg by mouth daily.     lisinopril -hydrochlorothiazide  (ZESTORETIC ) 20-25 MG tablet Take 1 tablet by mouth daily.     amoxicillin -clavulanate (AUGMENTIN ) 875-125 MG tablet Take 1 tablet by mouth every 12 (twelve) hours. 14 tablet 0   ibuprofen  (ADVIL ,MOTRIN ) 600 MG tablet Take 1 tablet (600 mg total) by mouth every 6 (six) hours as needed. 30 tablet 0   ondansetron  (ZOFRAN -ODT) 4 MG disintegrating tablet Take 1 tablet (4 mg total) by mouth every 8 (eight) hours as needed for nausea or vomiting. 20 tablet 0   oxyCODONE -acetaminophen  (PERCOCET/ROXICET) 5-325 MG tablet Take 1 tablet by mouth every 6 (six) hours as needed for severe pain (pain score 7-10). 6 tablet 0   potassium chloride  SA (KLOR-CON  M) 20 MEQ tablet Take 1 tablet (20 mEq total) by mouth daily. 5 tablet 0   No facility-administered medications prior to visit.   Allergies  Allergen Reactions   Flexeril [Cyclobenzaprine] Other (See Comments)    REACTIONS: Headaches and flushing (feels hot)   Prednisone Other (See Comments)    Increases blood  pressure   ROS: see HPI    Objective   Today's Vitals   05/17/24 0818 05/17/24 0858  BP: (!) 145/97 (!) 148/101  Pulse: 76   Resp: 16   SpO2: 97%   Weight: (!) 339 lb (153.8 kg)   Height: 5' 10 (1.778 m)   PainSc: 0-No pain     GENERAL: Well-appearing, in NAD. Well nourished.  SKIN: Pink, warm and dry. No rash, lesion, ulceration, or ecchymoses.  Head: Normocephalic. NECK: Trachea midline. Full ROM w/o pain or tenderness. No lymphadenopathy.  EARS: Tympanic membranes are intact, translucent without bulging and without drainage. Appropriate landmarks visualized.  EYES: Conjunctiva clear without exudates. EOMI, PERRL, no drainage present.  NOSE: Septum midline w/o deformity. Nares patent, mucosa pink and non-inflamed w/o drainage. No sinus tenderness.  THROAT: Uvula midline. Oropharynx clear. Tonsils non-inflamed without exudate. Mucous membranes pink and moist.  RESPIRATORY: Chest wall symmetrical. Respirations even and non-labored. Breath sounds clear to auscultation bilaterally.  CARDIAC: S1, S2 present, regular rate and rhythm without murmur or gallops. Peripheral pulses 2+ bilaterally.  MSK: Muscle tone and strength appropriate for age. Joints w/o tenderness, redness, or swelling.  EXTREMITIES: Without clubbing, cyanosis, or edema.  NEUROLOGIC: No motor or sensory deficits. Steady, even gait. C2-C12 intact.  PSYCH/MENTAL STATUS: Alert, oriented x 3. Cooperative, appropriate mood and affect.   Assessment & Plan:   1. Encounter to establish care (Primary) Patient is a 19- year-old male who presents today to establish care with primary care at [facility]. Reviewed the past medical history, family history, social history, surgical history, medications and allergies today- updates made as indicated. Patient has concerns today about the following concerns:   2. Primary hypertension Hypertension uncontrolled due to discontinuation of lisinopril -hydrochlorothiazide . History of  control on lisinopril -hydrochlorothiazide  20-25 mg. Restart lisinopril -hydrochlorothiazide  20-25 mg daily. Repeat blood pressure measurement during the visit with elevated blood pressure.  - CBC with Differential/Platelet - Comprehensive metabolic panel with GFR - lisinopril -hydrochlorothiazide  (ZESTORETIC ) 20-25 MG tablet; Take 1 tablet by mouth daily.  Dispense: 90 tablet; Refill: 3  3. Anxiety GAD7 completed with score of 15. Symptoms worsened after a series of personal stressors. Restart Celexa  at 20 mg daily for two weeks, then increase to 40 mg daily. Monitor for side effects, particularly gastrointestinal.  Will follow-up in 4-6 weeks.  - citalopram  (CELEXA ) 20 MG tablet; Take 1 tablet (20mg ) citalopram  once daily for 14 days. Take 2 tablets (40mg ) citalopram  once daily.  Dispense: 90 tablet; Refill: 3  4. MDD (major depressive disorder), recurrent episode, moderate (HCC) PHQ9 completed today with score of 16. Denies SI/HI. Reports mood is well controlled on 40mg  Celexa . Will start at 20mg  for 14 days and increase to 40mg  daily thereafter. Will follow-up in 4-6 weeks.  - citalopram  (CELEXA ) 20 MG tablet; Take 1 tablet (20mg ) citalopram  once daily for 14 days. Take 2 tablets (40mg ) citalopram  once daily.  Dispense: 90 tablet; Refill: 3  5. Morbidly obese (HCC) Significant weight gain of 42 pounds over four months. Insulin resistance is a concern. Check A1c to assess for insulin resistance or diabetes. Discuss dietary habits and emotional eating. - Hemoglobin A1c - Lipid panel - TSH Rfx on Abnormal to Free T4  6. Tick bite, unspecified site, sequela Worsening pain and fatigue possibly due to fibromyalgia, recent tick bite about four months ago, or insulin resistance. Order Lyme disease titers. - Lyme Disease Serology w/Reflex  7. Chronic fatigue Check thyroid function, blood count, and kidney function. Consider differential diagnosis including fibromyalgia and insulin resistance.  8.  Chronic pain syndrome See #7  Return in about 6 weeks (around 06/28/2024) for HTN follow-up, Mood f/u.   Evalene Arts, FNP

## 2024-05-31 ENCOUNTER — Telehealth: Payer: Self-pay | Admitting: Family Medicine

## 2024-05-31 NOTE — Telephone Encounter (Signed)
 Patient inquired about cost of labs and what was ordered. I called patient and unfortunately we do not know the cost for the [facility] labs. I provided the codes and phone number so you can call to see what the cost would be.   Labs ordered: 005009 CBC 322000 CMP [id number] A1C 221010 Lipid [id number] TSH [id number] L[place]  [facility] [phone] [place]  Patient asked if Kristina can add a lab for STD's, Please reach out to patient if any questions. Orders will need to be released and sent to the [facility].

## 2024-05-31 NOTE — Telephone Encounter (Signed)
 error

## 2024-05-31 NOTE — Telephone Encounter (Deleted)
 Copied from CRM #[id number]. Topic: Clinical - Lab/Test Results >> May 27, 2024  3:41 PM Nathanel BROCKS wrote: Reason for CRM: pt had appt with dr 10 days ago and is needing lab work done. He is wanting to go in the area of [place]. Can you please provide pt with the nearest [facility] and an order so that he can get this done.  Pt is also wanting to know what all labs is being ordered for him. (Pt is requesting lymes disease test) Please advise pt Pt states that he needs to know the cost of the labs being ordered.

## 2024-06-01 NOTE — Telephone Encounter (Signed)
 Lvm to let the patient know we cannot add on the lab we discussed and will need to be seen in office for the additional lab he is needing.

## 2024-06-29 ENCOUNTER — Ambulatory Visit: Payer: Self-pay | Admitting: Family Medicine

## 2024-07-20 ENCOUNTER — Ambulatory Visit: Payer: Self-pay

## 2024-09-22 ENCOUNTER — Emergency Department (HOSPITAL_COMMUNITY)
Admission: EM | Admit: 2024-09-22 | Discharge: 2024-09-22 | Disposition: A | Discharge status: Home / Self Care | Payer: Self-pay | Attending: Emergency Medicine | Admitting: Emergency Medicine

## 2024-09-22 ENCOUNTER — Emergency Department (HOSPITAL_COMMUNITY): Payer: Self-pay

## 2024-09-22 ENCOUNTER — Other Ambulatory Visit: Payer: Self-pay

## 2024-09-22 ENCOUNTER — Encounter (HOSPITAL_COMMUNITY): Payer: Self-pay

## 2024-09-22 DIAGNOSIS — Y990 Civilian activity done for income or pay: Secondary | ICD-10-CM | POA: Insufficient documentation

## 2024-09-22 DIAGNOSIS — I10 Essential (primary) hypertension: Secondary | ICD-10-CM | POA: Insufficient documentation

## 2024-09-22 DIAGNOSIS — S4991XA Unspecified injury of right shoulder and upper arm, initial encounter: Secondary | ICD-10-CM | POA: Insufficient documentation

## 2024-09-22 DIAGNOSIS — W228XXA Striking against or struck by other objects, initial encounter: Secondary | ICD-10-CM | POA: Insufficient documentation

## 2024-09-22 DIAGNOSIS — M25521 Pain in right elbow: Secondary | ICD-10-CM | POA: Insufficient documentation

## 2024-09-22 DIAGNOSIS — Z79899 Other long term (current) drug therapy: Secondary | ICD-10-CM | POA: Insufficient documentation

## 2024-09-22 NOTE — Discharge Instructions (Addendum)
 Your examination today is most concerning for a muscular injury Xrays were negative for are or elbow kind of fracture.  1. Medications: alternate ibuprofen  and tylenol  for pain control, take all usual home medications as they are prescribed 2. Treatment: rest, ice, elevate and use an ACE wrap or other compressive therapy to decrease swelling. Also drink plenty of fluids and do plenty of gentle stretching and move the affected muscle through its normal range of motion to prevent stiffness. 3. Follow Up: If your symptoms do not improve please follow up with orthopedics/sports medicine or your PCP for discussion of your diagnoses and further evaluation after today's visit; if you do not have a primary care doctor use the resource guide provided to find one; Please return to the ER for worsening symptoms or other concerns.

## 2024-09-22 NOTE — ED Triage Notes (Signed)
 Pt. Stated, I was working on my car and Im not really sure what happen but I injured my right arm at the elbow and upper arm. This was Jan 8 and its still hurting.

## 2024-09-22 NOTE — ED Provider Notes (Signed)
 " [place] EMERGENCY DEPARTMENT AT [facility] Provider Note   CSN: [id number] Arrival date & time: 09/22/24  1226     Patient presents with: Arm Pain and Arm Injury   Christopher Bender is a 54 y.o. male.  Past Medical History:  Diagnosis Date   Depression    Essential hypertension, benign    Lumbar disc disease    Varicose veins      Arm Pain  Arm Injury  54 yo patient presented to ED for right arm and elbow pain. Patient states that two weeks ago he was working on his car when his arm got stuck inside engine. Patient was able to work arm out in about five minutes. Patient did not notice any pain at the time. Later that day, he was using a power drill when drill wrenched arm upwards unexpectedly. Patient started experiencing elbow soreness next day. Endorses pain with active elbow extension. Patient has been RICEing arm with some relief but states he is still experiencing pain.  Denies numbness, tingling, pain at rest, wrist pain, shoulder pain, abrasions, lacerations.    Prior to Admission medications  Medication Sig Start Date End Date Taking? Authorizing Provider  citalopram  (CELEXA ) 20 MG tablet Take 1 tablet (20mg ) citalopram  once daily for 14 days. Take 2 tablets (40mg ) citalopram  once daily. 05/17/24   Towana Small, FNP  lisinopril -hydrochlorothiazide  (ZESTORETIC ) 20-25 MG tablet Take 1 tablet by mouth daily. 05/17/24   Towana Small, FNP    Allergies: Flexeril [cyclobenzaprine] and Prednisone    Review of Systems  Musculoskeletal:  Positive for arthralgias.       Diffuse tenderness to palpation over anterior and posterior aspect of right elbow and medial and lateral epicondyle. No edema or erythema noted.     Updated Vital Signs BP 137/84   Pulse (!) 104   Temp 98.1 F (36.7 C)   Resp 16   SpO2 96%   Physical Exam Vitals and nursing note reviewed.  Constitutional:      General: He is not in acute distress.    Appearance: He is  well-developed.  HENT:     Head: Normocephalic and atraumatic.  Eyes:     Conjunctiva/sclera: Conjunctivae normal.  Cardiovascular:     Rate and Rhythm: Normal rate and regular rhythm.     Heart sounds: No murmur heard. Pulmonary:     Effort: Pulmonary effort is normal. No respiratory distress.     Breath sounds: Normal breath sounds.  Abdominal:     Palpations: Abdomen is soft.     Tenderness: There is no abdominal tenderness.  Musculoskeletal:        General: Tenderness and signs of injury present. No swelling or deformity. Normal range of motion.     Cervical back: Neck supple.     Comments: Diffuse tenderness to palpation over anterior and posterior aspect of right elbow and medial and lateral epicondyle. No edema or erythema noted.   Skin:    General: Skin is warm and dry.     Capillary Refill: Capillary refill takes less than 2 seconds.  Neurological:     Mental Status: He is alert.     Sensory: No sensory deficit.     Motor: No weakness.  Psychiatric:        Mood and Affect: Mood normal.     (all labs ordered are listed, but only abnormal results are displayed) Labs Reviewed - No data to display  EKG: None  Radiology: DG Elbow Complete Right  Result Date: 09/22/2024 CLINICAL DATA:  Right elbow pain after injury 2 weeks ago EXAM: RIGHT ELBOW - COMPLETE 3+ VIEW COMPARISON:  None Available. FINDINGS: There is no evidence of fracture, dislocation, or joint effusion. There is no evidence of arthropathy or other focal bone abnormality. Soft tissues are unremarkable. IMPRESSION: Negative. Electronically Signed   By: Lynwood Landy Raddle M.D.   On: 09/22/2024 13:27   DG Humerus Right Result Date: 09/22/2024 CLINICAL DATA:  Right arm after injury 2 weeks ago EXAM: RIGHT HUMERUS - 2+ VIEW COMPARISON:  None Available. FINDINGS: There is no evidence of fracture or other focal bone lesions. Soft tissues are unremarkable. IMPRESSION: Negative. Electronically Signed   By: Lynwood Landy Raddle  M.D.   On: 09/22/2024 13:26     Procedures   Medications Ordered in the ED - No data to display                                  Medical Decision Making Amount and/or Complexity of Data Reviewed Radiology: ordered.  This patient presents to the ED for concern of right elbow pain, this involves a number of treatment options, and is a complaint that carries with it a mild-moderate risk of complications and morbidity. A differential diagnosis was considered for the patient's symptoms which is discussed below:   Fracture vs contusion vs strain   Co morbidities: Discussed in HPI   Brief History:  54 yo male presenting to ED complaining of two weeks of right elbow pain. Patient injured elbow while working on car. States arm was stuck in engine and took 5 minutes to pull out. Then same arm was wrenches upwards by power drill with unexpected force. Patient experiencing pain with active extension of elbow since.    EMR reviewed including pt PMHx, past surgical history and past visits to ER.   See HPI for more details   Imaging Studies:  NAD. I personally reviewed all imaging studies and no acute abnormality found. I agree with radiology interpretation.     Critical Interventions:  NA   Consults/Attending Physician   I discussed this case with my attending physician who cosigned this note including patient's presenting symptoms, physical exam, and planned diagnostics and interventions. Attending physician stated agreement with plan or made changes to plan which were implemented.   Reevaluation:  After the interventions noted above I re-evaluated patient and found that they have :stayed the same   Social Determinants of Health:  NA    Problem List / ED Course:  Patient presenting with 2 weeks of elbow pain after injuring elbow while working on car.  Patient overall well appearing. Patient is diffusely tender to palpation over right anterior/posterior aspect of  elbow as well as lateral and medial epicondyles. Good perfusion and sensation of distal extremities. Given symptoms and chronicity of pain, obtaining xrays. Xrays negative for fracture, dislocations, or effusions. Patient likely has severe strain.  Patient safe for discharge with ortho referral given, RICE instructions, and return precautions. Patient declined sling at this time.   Dispostion:  After consideration of the diagnostic results and the patients response to treatment, I feel that the patent would benefit from discharge, ortho referral, and return precautions.       Final diagnoses:  Injury of right upper extremity, initial encounter    ED Discharge Orders     None          Hawa Henly,  [place] DASEN, PA-C 09/22/24 1446  "
